# Patient Record
Sex: Male | Born: 1950
Health system: Southern US, Community
[De-identification: ages and names within clinical notes are randomized; demographics above are authoritative.]

## PROBLEM LIST (undated history)

## (undated) DIAGNOSIS — E785 Hyperlipidemia, unspecified: Secondary | ICD-10-CM

## (undated) DIAGNOSIS — F1020 Alcohol dependence, uncomplicated: Secondary | ICD-10-CM

## (undated) DIAGNOSIS — K7010 Alcoholic hepatitis without ascites: Secondary | ICD-10-CM

## (undated) HISTORY — DX: Hyperlipidemia, unspecified: E78.5

## (undated) HISTORY — DX: Alcoholic hepatitis without ascites: K70.10

## (undated) HISTORY — DX: Alcohol dependence, uncomplicated: F10.20

---

## 1973-01-28 HISTORY — PX: VASECTOMY: SHX75

## 2012-08-06 ENCOUNTER — Ambulatory Visit: Payer: Self-pay | Admitting: Family Medicine

## 2016-01-04 DIAGNOSIS — F1020 Alcohol dependence, uncomplicated: Secondary | ICD-10-CM | POA: Insufficient documentation

## 2016-01-04 DIAGNOSIS — K7010 Alcoholic hepatitis without ascites: Secondary | ICD-10-CM | POA: Insufficient documentation

## 2016-01-04 DIAGNOSIS — E785 Hyperlipidemia, unspecified: Secondary | ICD-10-CM | POA: Insufficient documentation

## 2016-01-04 DIAGNOSIS — E782 Mixed hyperlipidemia: Secondary | ICD-10-CM

## 2016-02-01 ENCOUNTER — Ambulatory Visit: Payer: Self-pay | Admitting: Family Medicine

## 2016-02-15 ENCOUNTER — Ambulatory Visit: Payer: Self-pay | Admitting: Family Medicine

## 2016-05-06 ENCOUNTER — Encounter: Payer: Self-pay | Admitting: Family Medicine

## 2016-05-06 ENCOUNTER — Ambulatory Visit (INDEPENDENT_AMBULATORY_CARE_PROVIDER_SITE_OTHER): Payer: Medicare HMO | Admitting: Family Medicine

## 2016-05-06 VITALS — BP 118/70 | HR 82 | Temp 98.7°F | Resp 17 | Ht 72.0 in | Wt 163.0 lb

## 2016-05-06 DIAGNOSIS — Z125 Encounter for screening for malignant neoplasm of prostate: Secondary | ICD-10-CM

## 2016-05-06 DIAGNOSIS — E782 Mixed hyperlipidemia: Secondary | ICD-10-CM

## 2016-05-06 DIAGNOSIS — K7010 Alcoholic hepatitis without ascites: Secondary | ICD-10-CM

## 2016-05-06 DIAGNOSIS — N390 Urinary tract infection, site not specified: Secondary | ICD-10-CM | POA: Diagnosis not present

## 2016-05-06 NOTE — Progress Notes (Signed)
BP 118/70   Pulse 82   Temp 98.7 F (37.1 C) (Oral)   Resp 17   Ht 6' (1.829 m)   Wt 163 lb (73.9 kg)   SpO2 100%   BMI 22.11 kg/m    Subjective:    Patient ID: Edgar Cruz, male    DOB: 1951/01/07, 66 y.o.   MRN: [id number]  HPI: Edgar Cruz is a 66 y.o. male who presents today to change care from another provider who has left the practice after being lost to follow up. He is here today to reestablish care  Chief Complaint  Patient presents with  . Establish Care   HYPERLIPIDEMIA Hyperlipidemia status: Not on anything, not watching his diet Satisfied with current treatment?  yes Side effects:  Not on anything Past cholesterol meds: None Supplements: none Aspirin:  no Chest pain:  no Coronary artery disease:  unknown Family history CAD:  unknown  History of elevated LFTs- saw GI in 2014, recommended MRCP for bilated CBD- did not have done. Negative Hepatitis, thought to be due to alcoholic hepatitis. States he's cut down on his ETOH intake. Feeling well. No concerns.    Active Ambulatory Problems    Diagnosis Date Noted  . Hyperlipidemia   . Alcoholic hepatitis   . Alcohol dependence (HCC)    Resolved Ambulatory Problems    Diagnosis Date Noted  . No Resolved Ambulatory Problems   Past Medical History:  Diagnosis Date  . Alcohol dependence (HCC)   . Alcoholic hepatitis   . Hyperlipidemia    Past Surgical History:  Procedure Laterality Date  . VASECTOMY  1975    No outpatient encounter prescriptions on file as of 05/06/2016.   No facility-administered encounter medications on file as of 05/06/2016.    No Known Allergies  Social History   Social History  . Marital status: Married    Spouse name: N/A  . Number of children: N/A  . Years of education: N/A   Social History Main Topics  . Smoking status: Current Every Day Smoker    Types: Cigarettes  . Smokeless tobacco: Never Used  . Alcohol use 1.8 oz/week    3 Cans of beer per week     Comment:  Wine 3 times a month  . Drug use: No  . Sexual activity: Not Asked   Other Topics Concern  . None   Social History Narrative  . None   Family History  Problem Relation Age of Onset  . Arthritis Mother   . Heart disease Brother     Review of Systems  Constitutional: Negative.   Respiratory: Negative.   Cardiovascular: Negative.   Gastrointestinal: Negative.   Psychiatric/Behavioral: Negative.     Per HPI unless specifically indicated above     Objective:    BP 118/70   Pulse 82   Temp 98.7 F (37.1 C) (Oral)   Resp 17   Ht 6' (1.829 m)   Wt 163 lb (73.9 kg)   SpO2 100%   BMI 22.11 kg/m   Wt Readings from Last 3 Encounters:  05/06/16 163 lb (73.9 kg)  12/08/12 163 lb (73.9 kg)    Physical Exam  Constitutional: He is oriented to person, place, and time. He appears well-developed and well-nourished. No distress.  HENT:  Head: Normocephalic and atraumatic.  Right Ear: Hearing normal.  Left Ear: Hearing normal.  Nose: Nose normal.  Eyes: Conjunctivae and lids are normal. Right eye exhibits no discharge. Left eye exhibits no discharge. No  scleral icterus.  Cardiovascular: Normal rate, regular rhythm, normal heart sounds and intact distal pulses.  Exam reveals no gallop and no friction rub.   No murmur heard. Pulmonary/Chest: Effort normal and breath sounds normal. No respiratory distress. He has no wheezes. He has no rales. He exhibits no tenderness.  Abdominal: Soft. Bowel sounds are normal. He exhibits no distension and no mass. There is no tenderness. There is no rebound and no guarding.  Musculoskeletal: Normal range of motion.  Neurological: He is alert and oriented to person, place, and time.  Skin: Skin is warm, dry and intact. No rash noted. He is not diaphoretic. No erythema. No pallor.  Psychiatric: He has a normal mood and affect. His speech is normal and behavior is normal. Judgment and thought content normal. Cognition and memory are normal.  Nursing  note and vitals reviewed.   No results found for this or any previous visit.    Assessment & Plan:   Problem List Items Addressed This Visit      Digestive   Alcoholic hepatitis    Recheck labs today. Feeling well. Await results.       Relevant Orders   CBC with Differential/Platelet   Comprehensive metabolic panel   TSH   UA/M w/rflx Culture, Routine     Other   Hyperlipidemia - Primary    Rechecking labs today. Await results.       Relevant Orders   Comprehensive metabolic panel   Lipid Panel w/o Chol/HDL Ratio   UA/M w/rflx Culture, Routine    Other Visit Diagnoses    Screening for prostate cancer       Checking labs today. Await results.    Relevant Orders   PSA       Follow up plan: Return ASAP , for Medicare Wellness.

## 2016-05-06 NOTE — Assessment & Plan Note (Signed)
Recheck labs today. Feeling well. Await results.

## 2016-05-06 NOTE — Assessment & Plan Note (Signed)
Rechecking labs today. Await results.  

## 2016-05-06 NOTE — Patient Instructions (Addendum)
Pneumococcal Conjugate Vaccine suspension for injection What is this medicine? PNEUMOCOCCAL VACCINE (NEU mo KOK al vak SEEN) is a vaccine used to prevent pneumococcus bacterial infections. These bacteria can cause serious infections like pneumonia, meningitis, and blood infections. This vaccine will lower your chance of getting pneumonia. If you do get pneumonia, it can make your symptoms milder and your illness shorter. This vaccine will not treat an infection and will not cause infection. This vaccine is recommended for infants and young children, adults with certain medical conditions, and adults 65 years or older. This medicine may be used for other purposes; ask your health care provider or pharmacist if you have questions. COMMON BRAND NAME(S): Prevnar, Prevnar 13 What should I tell my health care provider before I take this medicine? They need to know if you have any of these conditions: -bleeding problems -fever -immune system problems -an unusual or allergic reaction to pneumococcal vaccine, diphtheria toxoid, other vaccines, latex, other medicines, foods, dyes, or preservatives -pregnant or trying to get pregnant -breast-feeding How should I use this medicine? This vaccine is for injection into a muscle. It is given by a health care professional. A copy of Vaccine Information Statements will be given before each vaccination. Read this sheet carefully each time. The sheet may change frequently. Talk to your pediatrician regarding the use of this medicine in children. While this drug may be prescribed for children as young as 6 weeks old for selected conditions, precautions do apply. Overdosage: If you think you have taken too much of this medicine contact a poison control center or emergency room at once. NOTE: This medicine is only for you. Do not share this medicine with others. What if I miss a dose? It is important not to miss your dose. Call your doctor or health care professional  if you are unable to keep an appointment. What may interact with this medicine? -medicines for cancer chemotherapy -medicines that suppress your immune function -steroid medicines like prednisone or cortisone This list may not describe all possible interactions. Give your health care provider a list of all the medicines, herbs, non-prescription drugs, or dietary supplements you use. Also tell them if you smoke, drink alcohol, or use illegal drugs. Some items may interact with your medicine. What should I watch for while using this medicine? Mild fever and pain should go away in 3 days or less. Report any unusual symptoms to your doctor or health care professional. What side effects may I notice from receiving this medicine? Side effects that you should report to your doctor or health care professional as soon as possible: -allergic reactions like skin rash, itching or hives, swelling of the face, lips, or tongue -breathing problems -confused -fast or irregular heartbeat -fever over 102 degrees F -seizures -unusual bleeding or bruising -unusual muscle weakness Side effects that usually do not require medical attention (report to your doctor or health care professional if they continue or are bothersome): -aches and pains -diarrhea -fever of 102 degrees F or less -headache -irritable -loss of appetite -pain, tender at site where injected -trouble sleeping This list may not describe all possible side effects. Call your doctor for medical advice about side effects. You may report side effects to FDA at 1-800-FDA-1088. Where should I keep my medicine? This does not apply. This vaccine is given in a clinic, pharmacy, doctor's office, or other health care setting and will not be stored at home. NOTE: This sheet is a summary. It may not cover all possible information.   If you have questions about this medicine, talk to your doctor, pharmacist, or health care provider.  2018 Elsevier/Gold  Standard (2013-10-21 10:27:27)  

## 2016-05-07 ENCOUNTER — Encounter: Payer: Self-pay | Admitting: Family Medicine

## 2016-05-07 LAB — CBC WITH DIFFERENTIAL/PLATELET
BASOS ABS: 0 10*3/uL (ref 0.0–0.2)
Basos: 0 %
EOS (ABSOLUTE): 0 10*3/uL (ref 0.0–0.4)
Eos: 1 %
HEMOGLOBIN: 14.2 g/dL (ref 13.0–17.7)
Hematocrit: 41.9 % (ref 37.5–51.0)
IMMATURE GRANULOCYTES: 0 %
Immature Grans (Abs): 0 10*3/uL (ref 0.0–0.1)
LYMPHS ABS: 1.2 10*3/uL (ref 0.7–3.1)
Lymphs: 25 %
MCH: 33.6 pg — ABNORMAL HIGH (ref 26.6–33.0)
MCHC: 33.9 g/dL (ref 31.5–35.7)
MCV: 99 fL — ABNORMAL HIGH (ref 79–97)
MONOCYTES: 13 %
Monocytes Absolute: 0.6 10*3/uL (ref 0.1–0.9)
Neutrophils Absolute: 2.8 10*3/uL (ref 1.4–7.0)
Neutrophils: 61 %
Platelets: 186 10*3/uL (ref 150–379)
RBC: 4.22 x10E6/uL (ref 4.14–5.80)
RDW: 13.5 % (ref 12.3–15.4)
WBC: 4.6 10*3/uL (ref 3.4–10.8)

## 2016-05-07 LAB — COMPREHENSIVE METABOLIC PANEL
A/G RATIO: 1.5 (ref 1.2–2.2)
ALK PHOS: 39 IU/L (ref 39–117)
ALT: 39 IU/L (ref 0–44)
AST: 48 IU/L — AB (ref 0–40)
Albumin: 4 g/dL (ref 3.6–4.8)
BILIRUBIN TOTAL: 1 mg/dL (ref 0.0–1.2)
BUN / CREAT RATIO: 8 — AB (ref 10–24)
BUN: 6 mg/dL — ABNORMAL LOW (ref 8–27)
CHLORIDE: 98 mmol/L (ref 96–106)
CO2: 26 mmol/L (ref 18–29)
Calcium: 9 mg/dL (ref 8.6–10.2)
Creatinine, Ser: 0.75 mg/dL — ABNORMAL LOW (ref 0.76–1.27)
GFR calc Af Amer: 110 mL/min/{1.73_m2} (ref >59)
GFR calc non Af Amer: 95 mL/min/{1.73_m2} (ref >59)
GLOBULIN, TOTAL: 2.7 g/dL (ref 1.5–4.5)
Glucose: 76 mg/dL (ref 65–99)
POTASSIUM: 4 mmol/L (ref 3.5–5.2)
SODIUM: 140 mmol/L (ref 134–144)
Total Protein: 6.7 g/dL (ref 6.0–8.5)

## 2016-05-07 LAB — LIPID PANEL W/O CHOL/HDL RATIO
CHOLESTEROL TOTAL: 164 mg/dL (ref 100–199)
HDL: 92 mg/dL (ref >39)
LDL Calculated: 58 mg/dL (ref 0–99)
TRIGLYCERIDES: 69 mg/dL (ref 0–149)
VLDL Cholesterol Cal: 14 mg/dL (ref 5–40)

## 2016-05-07 LAB — TSH: TSH: 0.786 u[IU]/mL (ref 0.450–4.500)

## 2016-05-07 LAB — PSA: Prostate Specific Ag, Serum: 1.2 ng/mL (ref 0.0–4.0)

## 2016-05-08 LAB — UA/M W/RFLX CULTURE, ROUTINE
Bilirubin, UA: NEGATIVE
Glucose, UA: NEGATIVE
Nitrite, UA: NEGATIVE
PH UA: 7 (ref 5.0–7.5)
RBC UA: NEGATIVE
Specific Gravity, UA: 1.015 (ref 1.005–1.030)
UUROB: 2 mg/dL — AB (ref 0.2–1.0)

## 2016-05-08 LAB — MICROSCOPIC EXAMINATION: Bacteria, UA: NONE SEEN

## 2016-05-08 LAB — URINE CULTURE, REFLEX

## 2016-06-07 ENCOUNTER — Ambulatory Visit: Payer: Medicare HMO | Admitting: Family Medicine

## 2016-06-25 ENCOUNTER — Ambulatory Visit (INDEPENDENT_AMBULATORY_CARE_PROVIDER_SITE_OTHER): Payer: Medicare HMO | Admitting: Family Medicine

## 2016-06-25 ENCOUNTER — Encounter: Payer: Self-pay | Admitting: Family Medicine

## 2016-06-25 VITALS — BP 125/85 | HR 98 | Temp 99.1°F | Ht 69.7 in | Wt 152.6 lb

## 2016-06-25 DIAGNOSIS — Z0000 Encounter for general adult medical examination without abnormal findings: Secondary | ICD-10-CM

## 2016-06-25 DIAGNOSIS — Z1211 Encounter for screening for malignant neoplasm of colon: Secondary | ICD-10-CM

## 2016-06-25 DIAGNOSIS — F1020 Alcohol dependence, uncomplicated: Secondary | ICD-10-CM | POA: Diagnosis not present

## 2016-06-25 DIAGNOSIS — Z7189 Other specified counseling: Secondary | ICD-10-CM

## 2016-06-25 NOTE — Patient Instructions (Addendum)
Preventative Services:  Health Risk Assessment and Personalized Prevention Plan: Done today Bone Mass Measurements: N/A CVD Screening: Done last visit Colon Cancer Screening: Will do Cologuard Depression Screening: Done today Diabetes Screening: Done last visit Glaucoma Screening: See your eye doctor Hepatitis B vaccine: N/A Hepatitis C screening: up to date HIV Screening: N/A Flu Vaccine: Get in October Lung cancer Screening: Declined Obesity Screening: Done today Pneumonia Vaccines (2): Declined STI Screening: N/A PSA screening: done last visit   Health Maintenance, Male A healthy lifestyle and preventive care is important for your health and wellness. Ask your health care provider about what schedule of regular examinations is right for you. What should I know about weight and diet?  Eat a Healthy Diet  Eat plenty of vegetables, fruits, whole grains, low-fat dairy products, and lean protein.  Do not eat a lot of foods high in solid fats, added sugars, or salt. Maintain a Healthy Weight  Regular exercise can help you achieve or maintain a healthy weight. You should:  Do at least 150 minutes of exercise each week. The exercise should increase your heart rate and make you sweat (moderate-intensity exercise).  Do strength-training exercises at least twice a week. Watch Your Levels of Cholesterol and Blood Lipids  Have your blood tested for lipids and cholesterol every 5 years starting at 67 years of age. If you are at high risk for heart disease, you should start having your blood tested when you are 67 years old. You may need to have your cholesterol levels checked more often if:  Your lipid or cholesterol levels are high.  You are older than 67 years of age.  You are at high risk for heart disease. What should I know about cancer screening? Many types of cancers can be detected early and may often be prevented. Lung Cancer  You should be screened every year for lung  cancer if:  You are a current smoker who has smoked for at least 30 years.  You are a former smoker who has quit within the past 15 years.  Talk to your health care provider about your screening options, when you should start screening, and how often you should be screened. Colorectal Cancer  Routine colorectal cancer screening usually begins at 67 years of age and should be repeated every 5-10 years until you are 67 years old. You may need to be screened more often if early forms of precancerous polyps or small growths are found. Your health care provider may recommend screening at an earlier age if you have risk factors for colon cancer.  Your health care provider may recommend using home test kits to check for hidden blood in the stool.  A small camera at the end of a tube can be used to examine your colon (sigmoidoscopy or colonoscopy). This checks for the earliest forms of colorectal cancer. Prostate and Testicular Cancer  Depending on your age and overall health, your health care provider may do certain tests to screen for prostate and testicular cancer.  Talk to your health care provider about any symptoms or concerns you have about testicular or prostate cancer. Skin Cancer  Check your skin from head to toe regularly.  Tell your health care provider about any new moles or changes in moles, especially if:  There is a change in a mole's size, shape, or color.  You have a mole that is larger than a pencil eraser.  Always use sunscreen. Apply sunscreen liberally and repeat throughout the day.  Protect yourself by wearing long sleeves, pants, a wide-brimmed hat, and sunglasses when outside. What should I know about heart disease, diabetes, and high blood pressure?  If you are 80-74 years of age, have your blood pressure checked every 3-5 years. If you are 52 years of age or older, have your blood pressure checked every year. You should have your blood pressure measured twice-once  when you are at a hospital or clinic, and once when you are not at a hospital or clinic. Record the average of the two measurements. To check your blood pressure when you are not at a hospital or clinic, you can use:  An automated blood pressure machine at a pharmacy.  A home blood pressure monitor.  Talk to your health care provider about your target blood pressure.  If you are between 81-2 years old, ask your health care provider if you should take aspirin to prevent heart disease.  Have regular diabetes screenings by checking your fasting blood sugar level.  If you are at a normal weight and have a low risk for diabetes, have this test once every three years after the age of 13.  If you are overweight and have a high risk for diabetes, consider being tested at a younger age or more often.  A one-time screening for abdominal aortic aneurysm (AAA) by ultrasound is recommended for men aged 40-75 years who are current or former smokers. What should I know about preventing infection? Hepatitis B  If you have a higher risk for hepatitis B, you should be screened for this virus. Talk with your health care provider to find out if you are at risk for hepatitis B infection. Hepatitis C  Blood testing is recommended for:  Everyone born from [birth year] through 1966.  Anyone with known risk factors for hepatitis C. Sexually Transmitted Diseases (STDs)  You should be screened each year for STDs including gonorrhea and chlamydia if:  You are sexually active and are younger than 67 years of age.  You are older than 67 years of age and your health care provider tells you that you are at risk for this type of infection.  Your sexual activity has changed since you were last screened and you are at an increased risk for chlamydia or gonorrhea. Ask your health care provider if you are at risk.  Talk with your health care provider about whether you are at high risk of being infected with HIV. Your health  care provider may recommend a prescription medicine to help prevent HIV infection. What else can I do?  Schedule regular health, dental, and eye exams.  Stay current with your vaccines (immunizations).  Do not use any tobacco products, such as cigarettes, chewing tobacco, and e-cigarettes. If you need help quitting, ask your health care provider.  Limit alcohol intake to no more than 2 drinks per day. One drink equals 12 ounces of beer, 5 ounces of wine, or 1 ounces of hard liquor.  Do not use street drugs.  Do not share needles.  Ask your health care provider for help if you need support or information about quitting drugs.  Tell your health care provider if you often feel depressed.  Tell your health care provider if you have ever been abused or do not feel safe at home. This information is not intended to replace advice given to you by your health care provider. Make sure you discuss any questions you have with your health care provider. Document Released: 07/13/2007 Document Revised:  09/13/2015 Document Reviewed: 10/18/2014 Elsevier Interactive Patient Education  2017 [employer].

## 2016-06-25 NOTE — Progress Notes (Signed)
BP 125/85 (BP Location: Left Arm, Patient Position: Sitting, Cuff Size: Normal)   Pulse 98   Temp 99.1 F (37.3 C)   Ht 5' 9.7" (1.77 m)   Wt 152 lb 9.6 oz (69.2 kg)   SpO2 100%   BMI 22.08 kg/m    Subjective:    Patient ID: Edgar Cruz, male    DOB: 19-Sep-1950, 66 y.o.   MRN: [id number]  HPI: Edgar Cruz is a 66 y.o. male presenting on 06/25/2016 for comprehensive medical examination. Current medical complaints include:none  He currently lives with: alone Interim Problems from his last visit: no  Functional Status Survey: Is the patient deaf or have difficulty hearing?: No Does the patient have difficulty seeing, even when wearing glasses/contacts?: No Does the patient have difficulty concentrating, remembering, or making decisions?: No Does the patient have difficulty walking or climbing stairs?: No Does the patient have difficulty dressing or bathing?: No Does the patient have difficulty doing errands alone such as visiting a doctor's office or shopping?: No  FALL RISK: Fall Risk  06/25/2016 05/06/2016  Falls in the past year? No No    Depression Screen Depression screen [facility] 2/9 06/25/2016 05/06/2016  Decreased Interest 0 0  Down, Depressed, Hopeless 0 0  PHQ - 2 Score 0 0    Advanced Directives SEE APPROPRIATE AREA OF THE CHART  Past Medical History:  Past Medical History:  Diagnosis Date  . Alcohol dependence (HCC)   . Alcoholic hepatitis   . Hyperlipidemia     Surgical History:  Past Surgical History:  Procedure Laterality Date  . VASECTOMY  1975    Medications:  No current outpatient prescriptions on file prior to visit.   No current facility-administered medications on file prior to visit.     Allergies:  No Known Allergies  Social History:  Social History   Social History  . Marital status: Married    Spouse name: N/A  . Number of children: N/A  . Years of education: N/A   Occupational History  . Not on file.   Social History Main  Topics  . Smoking status: Current Every Day Smoker    Types: Cigarettes  . Smokeless tobacco: Never Used  . Alcohol use 1.8 oz/week    3 Cans of beer per week     Comment: Wine 3 times a month  . Drug use: No  . Sexual activity: Not on file   Other Topics Concern  . Not on file   Social History Narrative  . No narrative on file   History  Smoking Status  . Current Every Day Smoker  . Types: Cigarettes  Smokeless Tobacco  . Never Used   History  Alcohol Use  . 1.8 oz/week  . 3 Cans of beer per week    Comment: Wine 3 times a month    Family History:  Family History  Problem Relation Age of Onset  . Arthritis Mother   . Heart disease Brother     Past medical history, surgical history, medications, allergies, family history and social history reviewed with patient today and changes made to appropriate areas of the chart.   Review of Systems  Constitutional: Negative.   HENT: Negative.   Eyes: Negative.   Respiratory: Negative.   Cardiovascular: Negative.   Gastrointestinal: Negative.   Genitourinary: Negative.   Musculoskeletal: Negative.   Skin: Negative.   Neurological: Negative.   Endo/Heme/Allergies: Negative.   Psychiatric/Behavioral: Negative.     All other ROS negative except  what is listed above and in the HPI.      Objective:    BP 125/85 (BP Location: Left Arm, Patient Position: Sitting, Cuff Size: Normal)   Pulse 98   Temp 99.1 F (37.3 C)   Ht 5' 9.7" (1.77 m)   Wt 152 lb 9.6 oz (69.2 kg)   SpO2 100%   BMI 22.08 kg/m   Wt Readings from Last 3 Encounters:  06/25/16 152 lb 9.6 oz (69.2 kg)  05/06/16 163 lb (73.9 kg)  12/08/12 163 lb (73.9 kg)     Hearing Screening   125Hz  250Hz  500Hz  1000Hz  2000Hz  3000Hz  4000Hz  6000Hz  8000Hz   Right ear:   Fail 25 Fail  25    Left ear:   40 Fail 40  Fail      Visual Acuity Screening   Right eye Left eye Both eyes  Without correction: 20/20 20/20 20/20   With correction:       Physical Exam    Constitutional: He is oriented to person, place, and time. He appears well-developed and well-nourished. No distress.  HENT:  Head: Normocephalic and atraumatic.  Right Ear: Hearing, tympanic membrane, external ear and ear canal normal.  Left Ear: Hearing, tympanic membrane, external ear and ear canal normal.  Nose: Nose normal.  Mouth/Throat: Uvula is midline, oropharynx is clear and moist and mucous membranes are normal. No oropharyngeal exudate.  Eyes: Conjunctivae, EOM and lids are normal. Pupils are equal, round, and reactive to light. Right eye exhibits no discharge. Left eye exhibits no discharge. No scleral icterus.  Neck: Normal range of motion. Neck supple. No JVD present. No tracheal deviation present. No thyromegaly present.  Cardiovascular: Normal rate, regular rhythm, normal heart sounds and intact distal pulses.  Exam reveals no gallop and no friction rub.   No murmur heard. Pulmonary/Chest: Effort normal and breath sounds normal. No stridor. No respiratory distress. He has no wheezes. He has no rales. He exhibits no tenderness.  Abdominal: Soft. Bowel sounds are normal. He exhibits no distension and no mass. There is no tenderness. There is no rebound and no guarding. Hernia confirmed negative in the right inguinal area and confirmed negative in the left inguinal area.  Genitourinary: Rectum normal, testes normal and penis normal. Prostate is enlarged. Prostate is not tender. Cremasteric reflex is present. Right testis shows no mass, no swelling and no tenderness. Right testis is descended. Cremasteric reflex is not absent on the right side. Left testis shows no mass, no swelling and no tenderness. Left testis is descended. Cremasteric reflex is not absent on the left side. Uncircumcised. No phimosis, paraphimosis, hypospadias, penile erythema or penile tenderness. No discharge found.  Musculoskeletal: Normal range of motion. He exhibits no edema, tenderness or deformity.   Lymphadenopathy:    He has no cervical adenopathy.  Neurological: He is alert and oriented to person, place, and time. He has normal reflexes. He displays normal reflexes. No cranial nerve deficit. He exhibits normal muscle tone. Coordination normal.  Skin: Skin is warm, dry and intact. No rash noted. He is not diaphoretic. No erythema. No pallor.  Psychiatric: He has a normal mood and affect. His speech is normal and behavior is normal. Judgment and thought content normal. Cognition and memory are normal.  Nursing note and vitals reviewed.   6CIT Screen 06/25/2016 06/25/2016  What Year? 0 points 0 points  What month? 0 points 0 points  What time? 0 points 0 points  Count back from 20 0 points -  Months in  reverse 2 points -  Repeat phrase 2 points -  Total Score 4 -     Results for orders placed or performed in visit on 05/06/16  Microscopic Examination  Result Value Ref Range   WBC, UA 6-10 (A) 0 - 5 /hpf   RBC, UA 0-2 0 - 2 /hpf   Epithelial Cells (non renal) 0-10 0 - 10 /hpf   Mucus, UA Present Not Estab.   Bacteria, UA None seen None seen/Few  CBC with Differential/Platelet  Result Value Ref Range   WBC 4.6 3.4 - 10.8 x10E3/uL   RBC 4.22 4.14 - 5.80 x10E6/uL   Hemoglobin 14.2 13.0 - 17.7 g/dL   Hematocrit 40.9 81.1 - 51.0 %   MCV 99 (H) 79 - 97 fL   MCH 33.6 (H) 26.6 - 33.0 pg   MCHC 33.9 31.5 - 35.7 g/dL   RDW 91.4 78.2 - 95.6 %   Platelets 186 150 - 379 x10E3/uL   Neutrophils 61 Not Estab. %   Lymphs 25 Not Estab. %   Monocytes 13 Not Estab. %   Eos 1 Not Estab. %   Basos 0 Not Estab. %   Neutrophils Absolute 2.8 1.4 - 7.0 x10E3/uL   Lymphocytes Absolute 1.2 0.7 - 3.1 x10E3/uL   Monocytes Absolute 0.6 0.1 - 0.9 x10E3/uL   EOS (ABSOLUTE) 0.0 0.0 - 0.4 x10E3/uL   Basophils Absolute 0.0 0.0 - 0.2 x10E3/uL   Immature Granulocytes 0 Not Estab. %   Immature Grans (Abs) 0.0 0.0 - 0.1 x10E3/uL  Comprehensive metabolic panel  Result Value Ref Range   Glucose 76 65 -  99 mg/dL   BUN 6 (L) 8 - 27 mg/dL   Creatinine, Ser 2.13 (L) 0.76 - 1.27 mg/dL   GFR calc non Af Amer 95 >59 mL/min/1.73   GFR calc Af Amer 110 >59 mL/min/1.73   BUN/Creatinine Ratio 8 (L) 10 - 24   Sodium 140 134 - 144 mmol/L   Potassium 4.0 3.5 - 5.2 mmol/L   Chloride 98 96 - 106 mmol/L   CO2 26 18 - 29 mmol/L   Calcium 9.0 8.6 - 10.2 mg/dL   Total Protein 6.7 6.0 - 8.5 g/dL   Albumin 4.0 3.6 - 4.8 g/dL   Globulin, Total 2.7 1.5 - 4.5 g/dL   Albumin/Globulin Ratio 1.5 1.2 - 2.2   Bilirubin Total 1.0 0.0 - 1.2 mg/dL   Alkaline Phosphatase 39 39 - 117 IU/L   AST 48 (H) 0 - 40 IU/L   ALT 39 0 - 44 IU/L  Lipid Panel w/o Chol/HDL Ratio  Result Value Ref Range   Cholesterol, Total 164 100 - 199 mg/dL   Triglycerides 69 0 - 149 mg/dL   HDL 92 >08 mg/dL   VLDL Cholesterol Cal 14 5 - 40 mg/dL   LDL Calculated 58 0 - 99 mg/dL  TSH  Result Value Ref Range   TSH 0.786 0.450 - 4.500 uIU/mL  UA/M w/rflx Culture, Routine  Result Value Ref Range   Specific Gravity, UA 1.015 1.005 - 1.030   pH, UA 7.0 5.0 - 7.5   Color, UA Orange Yellow   Appearance Ur Cloudy (A) Clear   Leukocytes, UA 1+ (A) Negative   Protein, UA 2+ (A) Negative/Trace   Glucose, UA Negative Negative   Ketones, UA 1+ (A) Negative   RBC, UA Negative Negative   Bilirubin, UA Negative Negative   Urobilinogen, Ur 2.0 (H) 0.2 - 1.0 mg/dL   Nitrite, UA Negative Negative  Microscopic Examination See below:    Urinalysis Reflex Comment   PSA  Result Value Ref Range   Prostate Specific Ag, Serum 1.2 0.0 - 4.0 ng/mL  Urine Culture, Routine  Result Value Ref Range   Urine Culture, Routine Final report    Urine Culture result 1 Comment       Assessment & Plan:   Problem List Items Addressed This Visit      Other   Alcohol dependence (HCC)    Stable. Not interested in quitting right now.       Advance directive discussed with patient    Has not considered it. Information provided. A voluntary discussion about  advance care planning including the explanation and discussion of advance directives was extensively discussed  with the patient.  Explanation about the health care proxy and Living will was reviewed and packet with forms with explanation of how to fill them out was given. Patient was offered a separate Advance Care Planning visit for further assistance with forms.          Other Visit Diagnoses    Medicare annual wellness visit, subsequent    -  Primary   Preventative care discussed as below. Call with any concerns.    Screening for colon cancer       Will do cologuard. Ordered today.   Relevant Orders   Cologuard       Preventative Services:  Health Risk Assessment and Personalized Prevention Plan: Done today Bone Mass Measurements: N/A CVD Screening: Done last visit Colon Cancer Screening: Will do Cologuard Depression Screening: Done today Diabetes Screening: Done last visit Glaucoma Screening: See your eye doctor Hepatitis B vaccine: N/A Hepatitis C screening: up to date HIV Screening: N/A Flu Vaccine: Get in October Lung cancer Screening: Declined Obesity Screening: Done today Pneumonia Vaccines (2): Declined STI Screening: N/A PSA screening: done last visit  Discussed aspirin prophylaxis for myocardial infarction prevention and decision was it was not indicated  LABORATORY TESTING:  Health maintenance labs ordered today as discussed above.   The natural history of prostate cancer and ongoing controversy regarding screening and potential treatment outcomes of prostate cancer has been discussed with the patient. The meaning of a false positive PSA and a false negative PSA has been discussed. He indicates understanding of the limitations of this screening test and wished to proceed with screening PSA testing- checked last visit.   IMMUNIZATIONS:   - Tdap: Tetanus vaccination status reviewed: last tetanus booster within 10 years. - Influenza: Postponed to flu season -  Pneumovax: Refused - Prevnar: Refused - Zostavax vaccine: Will check on insurance  SCREENING: - Colonoscopy: Cologuard ordered today  Discussed with patient purpose of the colonoscopy is to detect colon cancer at curable precancerous or early stages   -Hearing Test: Ordered today  -Spirometry: Not applicable   PATIENT COUNSELING:    Sexuality: Discussed sexually transmitted diseases, partner selection, use of condoms, avoidance of unintended pregnancy  and contraceptive alternatives.   Advised to avoid cigarette smoking.  I discussed with the patient that most people either abstain from alcohol or drink within safe limits (<=14/week and <=4 drinks/occasion for males, <=7/weeks and <= 3 drinks/occasion for females) and that the risk for alcohol disorders and other health effects rises proportionally with the number of drinks per week and how often a drinker exceeds daily limits.  Discussed cessation/primary prevention of drug use and availability of treatment for abuse.   Diet: Encouraged to adjust caloric intake to maintain  or  achieve ideal body weight, to reduce intake of dietary saturated fat and total fat, to limit sodium intake by avoiding high sodium foods and not adding table salt, and to maintain adequate dietary potassium and calcium preferably from fresh fruits, vegetables, and low-fat dairy products.    stressed the importance of regular exercise  Injury prevention: Discussed safety belts, safety helmets, smoke detector, smoking near bedding or upholstery.   Dental health: Discussed importance of regular tooth brushing, flossing, and dental visits.   Follow up plan: NEXT PREVENTATIVE PHYSICAL DUE IN 1 YEAR. Return in about 1 year (around 06/25/2017) for Wellness.

## 2016-06-25 NOTE — Assessment & Plan Note (Signed)
Has not considered it. Information provided. A voluntary discussion about advance care planning including the explanation and discussion of advance directives was extensively discussed  with the patient.  Explanation about the health care proxy and Living will was reviewed and packet with forms with explanation of how to fill them out was given. Patient was offered a separate Advance Care Planning visit for further assistance with forms.

## 2016-06-25 NOTE — Assessment & Plan Note (Signed)
Stable. Not interested in quitting right now.

## 2017-03-20 ENCOUNTER — Other Ambulatory Visit: Payer: Self-pay

## 2017-03-20 ENCOUNTER — Ambulatory Visit: Payer: Self-pay | Admitting: *Deleted

## 2017-03-20 ENCOUNTER — Emergency Department: Payer: Medicare HMO

## 2017-03-20 ENCOUNTER — Emergency Department
Admission: EM | Admit: 2017-03-20 | Discharge: 2017-03-20 | Disposition: A | Discharge status: Home / Self Care | Payer: Medicare HMO | Attending: Emergency Medicine | Admitting: Emergency Medicine

## 2017-03-20 ENCOUNTER — Encounter: Payer: Self-pay | Admitting: Emergency Medicine

## 2017-03-20 DIAGNOSIS — Z5321 Procedure and treatment not carried out due to patient leaving prior to being seen by health care provider: Secondary | ICD-10-CM | POA: Diagnosis not present

## 2017-03-20 DIAGNOSIS — R079 Chest pain, unspecified: Secondary | ICD-10-CM | POA: Diagnosis not present

## 2017-03-20 LAB — CBC
HCT: 40.8 % (ref 40.0–52.0)
HEMOGLOBIN: 13.9 g/dL (ref 13.0–18.0)
MCH: 34.2 pg — ABNORMAL HIGH (ref 26.0–34.0)
MCHC: 34 g/dL (ref 32.0–36.0)
MCV: 100.6 fL — ABNORMAL HIGH (ref 80.0–100.0)
Platelets: 151 10*3/uL (ref 150–440)
RBC: 4.06 MIL/uL — ABNORMAL LOW (ref 4.40–5.90)
RDW: 13.3 % (ref 11.5–14.5)
WBC: 4.7 10*3/uL (ref 3.8–10.6)

## 2017-03-20 LAB — BASIC METABOLIC PANEL
Anion gap: 13 (ref 5–15)
BUN: 6 mg/dL (ref 6–20)
CHLORIDE: 103 mmol/L (ref 101–111)
CO2: 22 mmol/L (ref 22–32)
Calcium: 8.8 mg/dL — ABNORMAL LOW (ref 8.9–10.3)
Creatinine, Ser: 0.71 mg/dL (ref 0.61–1.24)
GFR calc Af Amer: >60 mL/min (ref >60)
Glucose, Bld: 77 mg/dL (ref 65–99)
Potassium: 3.8 mmol/L (ref 3.5–5.1)
Sodium: 138 mmol/L (ref 135–145)

## 2017-03-20 LAB — TROPONIN I: Troponin I: <0.03 ng/mL (ref <0.03)

## 2017-03-20 NOTE — Telephone Encounter (Signed)
Called in c/o having left sided chest pain "in this one spot" over my left chest.   He denies any other signs/symptoms with the chest pain.   He acknowledged he is a smoker.  He has been taking Advil for the last 2 days when the chest pain began.  It helps some but the pain never really goes away.  [facility] who [facility] with Dr. Laural BenesJohnson.   She instructed him to go to the ED.  He has agreed to do this.   [facility]  He is calling someone to take him now.   I instructed him that if he can't get a ride to call 911 so the ambulance could take him.   He said,  "I don't want to pay that extra money".   "I can get a ride now"    "I just need to make a phone call".    Reason for Disposition . [1] Chest pain lasts > 5 minutes AND [2] occurred > 3 days ago (72 hours) AND [3] NO chest pain or cardiac symptoms now  Answer Assessment - Initial Assessment Questions 1. LOCATION: "Where does it hurt?"       Left chest that start 2 days ago.    I've been taken some Advil and it helps a little. 2. RADIATION: "Does the pain go anywhere else?" (e.g., into neck, jaw, arms, back)     Just in one spot on the left side of my chest near my heart. 3. ONSET: "When did the chest pain begin?" (Minutes, hours or days)      2 days ago in the morning.   4. PATTERN "Does the pain come and go, or has it been constant since it started?"  "Does it get worse with exertion?"      Stays all the time. 5. DURATION: "How long does it last" (e.g., seconds, minutes, hours)     Constant.   The Advil helps  But it does not go all the way away. 6. SEVERITY: "How bad is the pain?"  (e.g., Scale 1-10; mild, moderate, or severe)    - MILD (1-3): doesn't interfere with normal activities     - MODERATE (4-7): interferes with normal activities or awakens from sleep    - SEVERE (8-10): excruciating pain, unable to do any normal activities       3 on the pain scale 7. CARDIAC  RISK FACTORS: "Do you have any history of heart problems or risk factors for heart disease?" (e.g., prior heart attack, angina; high blood pressure, diabetes, being overweight, high cholesterol, smoking, or strong family history of heart disease)     Smoker.    8. PULMONARY RISK FACTORS: "Do you have any history of lung disease?"  (e.g., blood clots in lung, asthma, emphysema, birth control pills)     No lung history but I do smoke. 9. CAUSE: "What do you think is causing the chest pain?"     I don't know.   It just showed up one day. 10. OTHER SYMPTOMS: "Do you have any other symptoms?" (e.g., dizziness, nausea, vomiting, sweating, fever, difficulty breathing, cough)       Denies any of the above. 11. PREGNANCY: "Is there any chance you are pregnant?" "When was your last menstrual period?"       N/A  Protocols used: CHEST PAIN-A-AH

## 2017-03-20 NOTE — ED Notes (Signed)
Third call to room pt, no show.

## 2017-03-20 NOTE — ED Triage Notes (Signed)
Pt with left chest pain since Monday. Pain 3/10 and feels like an ache. Pt states that advil makes pain better, last took this am. Pt reports that he has pain when laying in bed.

## 2017-03-20 NOTE — ED Notes (Signed)
Second call to room pt.

## 2017-03-20 NOTE — ED Notes (Signed)
Pt called to be taken back to room.

## 2017-03-20 NOTE — Telephone Encounter (Signed)
-   As below

## 2017-03-24 ENCOUNTER — Telehealth: Payer: Self-pay | Admitting: Emergency Medicine

## 2017-03-24 NOTE — Telephone Encounter (Signed)
Called patient due to lwot to inquire about condition and follow up plans. Says he is not having chest pain now and had to leave to get transportation.  He says he thought that everything was okay on his tests.  I explained that we cannot prove pain is not from heart without being seen by MD.  I advised him to call his pcp today to have them review the results and that they could possibly do exam in the office.  He agrees.

## 2017-04-01 ENCOUNTER — Ambulatory Visit: Payer: Medicare HMO | Admitting: Family Medicine

## 2017-04-01 ENCOUNTER — Encounter: Payer: Self-pay | Admitting: Family Medicine

## 2017-04-01 VITALS — BP 133/84 | HR 69 | Wt 154.0 lb

## 2017-04-01 DIAGNOSIS — F1020 Alcohol dependence, uncomplicated: Secondary | ICD-10-CM | POA: Diagnosis not present

## 2017-04-01 DIAGNOSIS — R079 Chest pain, unspecified: Secondary | ICD-10-CM

## 2017-04-01 DIAGNOSIS — E782 Mixed hyperlipidemia: Secondary | ICD-10-CM

## 2017-04-01 DIAGNOSIS — R062 Wheezing: Secondary | ICD-10-CM | POA: Diagnosis not present

## 2017-04-01 DIAGNOSIS — K7010 Alcoholic hepatitis without ascites: Secondary | ICD-10-CM

## 2017-04-01 MED ORDER — ALBUTEROL SULFATE (2.5 MG/3ML) 0.083% IN NEBU: 2.5000 mg | INHALATION_SOLUTION | Freq: Four times a day (QID) | RESPIRATORY_TRACT | 1 refills | Status: DC | PRN

## 2017-04-01 MED ORDER — ALBUTEROL SULFATE (2.5 MG/3ML) 0.083% IN NEBU
2.5000 mg | INHALATION_SOLUTION | Freq: Once | RESPIRATORY_TRACT | Status: AC
  Administered 2017-04-01: 2.5 mg via RESPIRATORY_TRACT

## 2017-04-01 MED ORDER — NAPROXEN 500 MG PO TABS: 500.0000 mg | ORAL_TABLET | Freq: Two times a day (BID) | ORAL | 3 refills | Status: AC

## 2017-04-01 MED ORDER — ALBUTEROL SULFATE HFA 108 (90 BASE) MCG/ACT IN AERS: 2.0000 | INHALATION_SPRAY | Freq: Four times a day (QID) | RESPIRATORY_TRACT | 3 refills | Status: AC | PRN

## 2017-04-01 NOTE — Patient Instructions (Addendum)
Chest Wall Pain °Chest wall pain is pain in or around the bones and muscles of your chest. Sometimes, an injury causes this pain. Sometimes, the cause may not be known. This pain may take several weeks or longer to get better. °Follow these instructions at home: °Pay attention to any changes in your symptoms. Take these actions to help with your pain: °· Rest as told by your health care provider. °· Avoid activities that cause pain. These include any activities that use your chest muscles or your abdominal and side muscles to lift heavy items. °· If directed, apply ice to the painful area: °? Put ice in a plastic bag. °? Place a towel between your skin and the bag. °? Leave the ice on for 20 minutes, 2-3 times per day. °· Take over-the-counter and prescription medicines only as told by your health care provider. °· Do not use tobacco products, including cigarettes, chewing tobacco, and e-cigarettes. If you need help quitting, ask your health care provider. °· Keep all follow-up visits as told by your health care provider. This is important. ° °Contact a health care provider if: °· You have a fever. °· Your chest pain becomes worse. °· You have new symptoms. °Get help right away if: °· You have nausea or vomiting. °· You feel sweaty or light-headed. °· You have a cough with phlegm (sputum) or you cough up blood. °· You develop shortness of breath. °This information is not intended to replace advice given to you by your health care provider. Make sure you discuss any questions you have with your health care provider. °Document Released: 01/14/2005 Document Revised: 05/25/2015 Document Reviewed: 04/11/2014 °[employer] Interactive Patient Education © 2018 [employer] ° °

## 2017-04-01 NOTE — Assessment & Plan Note (Signed)
Rechecking labs today. Await results.  

## 2017-04-01 NOTE — Progress Notes (Signed)
BP 133/84   Pulse 69   Wt 154 lb (69.9 kg)   SpO2 99%   BMI 20.89 kg/m    Subjective:    Patient ID: Edgar Cruz, male    DOB: 1950/08/15, 67 y.o.   MRN: [id number]  HPI: Edgar Cruz is a 67 y.o. male  Chief Complaint  Patient presents with  . Chest Pain    No longer experiencing. x 1 day. Pt awoke w/ pain. No SOB.    Per chart review, went to the ER 2 weeks ago with chest pain. "Pt with left chest pain since Monday. Pain 3/10 and feels like an ache. Pt states that advil makes pain better, last took this am. Pt reports that he has pain when laying in bed." Patient left the ER without being evaluated. Troponin negative x1. Elevated MCV without anemia and low calcium. Remainder of labs were normal.   CHEST PAIN- resolved, has not hurt since that time unless he pushes on it Time since onset: 2 weeks, has resolved Duration: 2 days Onset: sudden Quality: "pain" Severity: 3/10 Location: left para substernal Radiation: none Episode duration: 2 days Frequency: constant Related to exertion: no Activity when pain started:  Trauma: no Anxiety/recent stressors: no Aggravating factors: touching it Alleviating factors: ibuprofen Status: better Treatments attempted: ibuprofen  Current pain status: pain free Shortness of breath: no Cough: no Nausea: no Diaphoresis: no Heartburn: no Palpitations: no  Relevant past medical, surgical, family and social history reviewed and updated as indicated. Interim medical history since our last visit reviewed. Allergies and medications reviewed and updated.  Review of Systems  Constitutional: Negative.   HENT: Negative.   Respiratory: Negative.   Cardiovascular: Positive for chest pain. Negative for palpitations and leg swelling.  Gastrointestinal: Negative.   Psychiatric/Behavioral: Negative.     Per HPI unless specifically indicated above     Objective:    BP 133/84   Pulse 69   Wt 154 lb (69.9 kg)   SpO2 99%   BMI 20.89  kg/m   Wt Readings from Last 3 Encounters:  04/01/17 154 lb (69.9 kg)  03/20/17 150 lb (68 kg)  06/25/16 152 lb 9.6 oz (69.2 kg)    Physical Exam  Constitutional: He is oriented to person, place, and time. He appears well-developed and well-nourished. No distress.  HENT:  Head: Normocephalic and atraumatic.  Right Ear: Hearing normal.  Left Ear: Hearing normal.  Nose: Nose normal.  Eyes: Conjunctivae and lids are normal. Right eye exhibits no discharge. Left eye exhibits no discharge. No scleral icterus.  Cardiovascular: Normal rate, regular rhythm, normal heart sounds and intact distal pulses. Exam reveals no gallop and no friction rub.  No murmur heard. Pulmonary/Chest: Effort normal. No respiratory distress. He has wheezes. He has no rales. He exhibits no tenderness.  Musculoskeletal: Normal range of motion. He exhibits tenderness (tenderness to palpation of his L chest).  Neurological: He is alert and oriented to person, place, and time.  Skin: Skin is warm, dry and intact. No rash noted. He is not diaphoretic. No erythema. No pallor.  Psychiatric: He has a normal mood and affect. His speech is normal and behavior is normal. Judgment and thought content normal. Cognition and memory are normal.  Nursing note and vitals reviewed.   Results for orders placed or performed during the hospital encounter of 03/20/17  Basic metabolic panel  Result Value Ref Range   Sodium 138 135 - 145 mmol/L   Potassium 3.8 3.5 - 5.1 mmol/L  Chloride 103 101 - 111 mmol/L   CO2 22 22 - 32 mmol/L   Glucose, Bld 77 65 - 99 mg/dL   BUN 6 6 - 20 mg/dL   Creatinine, Ser 6.04 0.61 - 1.24 mg/dL   Calcium 8.8 (L) 8.9 - 10.3 mg/dL   GFR calc non Af Amer >60 >60 mL/min   GFR calc Af Amer >60 >60 mL/min   Anion gap 13 5 - 15  CBC  Result Value Ref Range   WBC 4.7 3.8 - 10.6 K/uL   RBC 4.06 (L) 4.40 - 5.90 MIL/uL   Hemoglobin 13.9 13.0 - 18.0 g/dL   HCT 54.0 98.1 - 19.1 %   MCV 100.6 (H) 80.0 - 100.0 fL    MCH 34.2 (H) 26.0 - 34.0 pg   MCHC 34.0 32.0 - 36.0 g/dL   RDW 47.8 29.5 - 62.1 %   Platelets 151 150 - 440 K/uL  Troponin I  Result Value Ref Range   Troponin I <0.03 <0.03 ng/mL      Assessment & Plan:   Problem List Items Addressed This Visit      Digestive   Alcoholic hepatitis    Rechecking labs today. Await results.       Relevant Orders   CBC with Differential/Platelet   Comprehensive metabolic panel   TSH     Other   Hyperlipidemia    Rechecking labs today. Await results.       Relevant Orders   CBC with Differential/Platelet   Comprehensive metabolic panel   Lipid Panel w/o Chol/HDL Ratio   TSH   Alcohol dependence (HCC)    Rechecking labs today. Await results.        Other Visit Diagnoses    Chest pain, unspecified type    -  Primary   Tender to palpation- will treat with naproxen. EKG shows PVCs, otherwise normal.   Relevant Orders   EKG 12-Lead (Completed)   CBC with Differential/Platelet   Comprehensive metabolic panel   TSH   Wheezing       Better following nebulizer. Rx for albuterol sent to his pharmacy. Call with any concerns.    Relevant Medications   albuterol (PROVENTIL) (2.5 MG/3ML) 0.083% nebulizer solution 2.5 mg (Completed)       Follow up plan: Return As scheduled.

## 2017-04-02 ENCOUNTER — Encounter: Payer: Self-pay | Admitting: Family Medicine

## 2017-04-02 LAB — CBC WITH DIFFERENTIAL/PLATELET
BASOS ABS: 0 10*3/uL (ref 0.0–0.2)
BASOS: 0 %
EOS (ABSOLUTE): 0 10*3/uL (ref 0.0–0.4)
Eos: 1 %
Hematocrit: 40.3 % (ref 37.5–51.0)
Hemoglobin: 13.9 g/dL (ref 13.0–17.7)
IMMATURE GRANS (ABS): 0 10*3/uL (ref 0.0–0.1)
Immature Granulocytes: 0 %
LYMPHS: 29 %
Lymphocytes Absolute: 1.7 10*3/uL (ref 0.7–3.1)
MCH: 33.4 pg — AB (ref 26.6–33.0)
MCHC: 34.5 g/dL (ref 31.5–35.7)
MCV: 97 fL (ref 79–97)
MONOS ABS: 0.9 10*3/uL (ref 0.1–0.9)
Monocytes: 15 %
NEUTROS ABS: 3.3 10*3/uL (ref 1.4–7.0)
Neutrophils: 55 %
PLATELETS: 198 10*3/uL (ref 150–379)
RBC: 4.16 x10E6/uL (ref 4.14–5.80)
RDW: 13.3 % (ref 12.3–15.4)
WBC: 6 10*3/uL (ref 3.4–10.8)

## 2017-04-02 LAB — COMPREHENSIVE METABOLIC PANEL
A/G RATIO: 1.6 (ref 1.2–2.2)
ALK PHOS: 44 IU/L (ref 39–117)
ALT: 52 IU/L — AB (ref 0–44)
AST: 61 IU/L — AB (ref 0–40)
Albumin: 4.5 g/dL (ref 3.6–4.8)
BILIRUBIN TOTAL: 0.7 mg/dL (ref 0.0–1.2)
BUN/Creatinine Ratio: 7 — ABNORMAL LOW (ref 10–24)
BUN: 5 mg/dL — AB (ref 8–27)
CHLORIDE: 98 mmol/L (ref 96–106)
CO2: 23 mmol/L (ref 20–29)
Calcium: 9.5 mg/dL (ref 8.6–10.2)
Creatinine, Ser: 0.73 mg/dL — ABNORMAL LOW (ref 0.76–1.27)
GFR calc Af Amer: 110 mL/min/{1.73_m2} (ref >59)
GFR calc non Af Amer: 95 mL/min/{1.73_m2} (ref >59)
GLUCOSE: 74 mg/dL (ref 65–99)
Globulin, Total: 2.9 g/dL (ref 1.5–4.5)
POTASSIUM: 3.8 mmol/L (ref 3.5–5.2)
Sodium: 138 mmol/L (ref 134–144)
TOTAL PROTEIN: 7.4 g/dL (ref 6.0–8.5)

## 2017-04-02 LAB — TSH: TSH: 0.948 u[IU]/mL (ref 0.450–4.500)

## 2017-04-02 LAB — LIPID PANEL W/O CHOL/HDL RATIO
Cholesterol, Total: 156 mg/dL (ref 100–199)
HDL: 95 mg/dL (ref >39)
LDL CALC: 52 mg/dL (ref 0–99)
Triglycerides: 43 mg/dL (ref 0–149)
VLDL CHOLESTEROL CAL: 9 mg/dL (ref 5–40)

## 2017-06-27 ENCOUNTER — Ambulatory Visit: Payer: Medicare HMO

## 2017-07-04 ENCOUNTER — Ambulatory Visit (INDEPENDENT_AMBULATORY_CARE_PROVIDER_SITE_OTHER): Payer: Medicare HMO

## 2017-07-04 VITALS — BP 118/64 | HR 85 | Temp 98.5°F | Resp 17 | Ht 71.0 in | Wt 168.4 lb

## 2017-07-04 DIAGNOSIS — Z0000 Encounter for general adult medical examination without abnormal findings: Secondary | ICD-10-CM

## 2017-07-04 NOTE — Progress Notes (Signed)
Subjective:   Edgar Cruz is a 67 y.o. male who presents for Medicare Annual/Subsequent preventive examination.  Review of Systems:  Cardiac Risk Factors include: advanced age (>2men, >13 women);male gender;smoking/ tobacco exposure     Objective:    Vitals: BP 118/64 (BP Location: Left Arm, Patient Position: Sitting)   Pulse 85   Temp 98.5 F (36.9 C) (Temporal)   Resp 17   Ht 5\' 11"  (1.803 m)   Wt 168 lb 6.4 oz (76.4 kg)   SpO2 98%   BMI 23.49 kg/m   Body mass index is 23.49 kg/m.  Advanced Directives 07/04/2017 03/20/2017 06/25/2016  Does Patient Have a Medical Advance Directive? No No No  Would patient like information on creating a medical advance directive? No - Patient declined - Yes (MAU/Ambulatory/Procedural Areas - Information given)    Tobacco Social History   Tobacco Use  Smoking Status Current Every Day Smoker  . Packs/day: 1.00  . Types: Cigarettes  Smokeless Tobacco Never Used     Ready to quit: No Counseling given: Yes   Clinical Intake:  Pre-visit preparation completed: Yes  Pain : No/denies pain     Nutritional Status: BMI of 19-24  Normal Nutritional Risks: None Diabetes: No  How often do you need to have someone help you when you read instructions, pamphlets, or other written materials from your doctor or pharmacy?: 1 - Never What is the last grade level you completed in school?: 12th grade  Interpreter Needed?: No  Information entered by :: Aleiya Rye,LPN   Past Medical History:  Diagnosis Date  . Alcohol dependence (HCC)   . Alcoholic hepatitis   . Hyperlipidemia    Past Surgical History:  Procedure Laterality Date  . VASECTOMY  1975   Family History  Problem Relation Age of Onset  . Arthritis Mother   . Heart disease Brother   . Parkinson's disease Sister    Social History   Socioeconomic History  . Marital status: Married    Spouse name: Not on file  . Number of children: Not on file  . Years of education: Not  on file  . Highest education level: Not on file  Occupational History  . Not on file  Social Needs  . Financial resource strain: Not hard at all  . Food insecurity:    Worry: Never true    Inability: Never true  . Transportation needs:    Medical: No    Non-medical: No  Tobacco Use  . Smoking status: Current Every Day Smoker    Packs/day: 1.00    Types: Cigarettes  . Smokeless tobacco: Never Used  Substance and Sexual Activity  . Alcohol use: Yes    Alcohol/week: 2.4 oz    Types: 4 Cans of beer per week  . Drug use: No  . Sexual activity: Not on file  Lifestyle  . Physical activity:    Days per week: 0 days    Minutes per session: 0 min  . Stress: Not at all  Relationships  . Social connections:    Talks on phone: More than three times a week    Gets together: Twice a week    Attends religious service: More than 4 times per year    Active member of club or organization: No    Attends meetings of clubs or organizations: Never    Relationship status: Widowed  Other Topics Concern  . Not on file  Social History Narrative  . Not on file  Outpatient Encounter Medications as of 07/04/2017  Medication Sig  . albuterol (PROVENTIL HFA;VENTOLIN HFA) 108 (90 Base) MCG/ACT inhaler Inhale 2 puffs into the lungs every 6 (six) hours as needed for wheezing or shortness of breath. (Patient not taking: Reported on 07/04/2017)  . naproxen (NAPROSYN) 500 MG tablet Take 1 tablet (500 mg total) by mouth 2 (two) times daily with a meal. (Patient not taking: Reported on 07/04/2017)   No facility-administered encounter medications on file as of 07/04/2017.     Activities of Daily Living In your present state of health, do you have any difficulty performing the following activities: 07/04/2017  Hearing? N  Vision? N  Difficulty concentrating or making decisions? N  Walking or climbing stairs? N  Dressing or bathing? N  Doing errands, shopping? N  Preparing Food and eating ? N  Using the  Toilet? N  In the past six months, have you accidently leaked urine? N  Do you have problems with loss of bowel control? N  Managing your Medications? N  Managing your Finances? N  Housekeeping or managing your Housekeeping? N  Some recent data might be hidden    Patient Care Team: Dorcas CarrowJohnson, Megan P, DO as PCP - General (Family Medicine)   Assessment:   This is a routine wellness examination for [facility].  Exercise Activities and Dietary recommendations Current Exercise Habits: The patient has a physically strenous job, but has no regular exercise apart from work., Exercise limited by: None identified  Goals    . Quit Smoking     Smoking cessation discussed       Fall Risk Fall Risk  07/04/2017 06/25/2016 05/06/2016  Falls in the past year? No No No   Is the patient's home free of loose throw rugs in walkways, pet beds, electrical cords, etc?   no      Grab bars in the bathroom? no      Handrails on the stairs?   no      Adequate lighting?   yes  Timed Get Up and Go Performed: Completed in 9 seconds with no use of assistive devices, steady gait. No intervention needed at this time.   Depression Screen PHQ 2/9 Scores 07/04/2017 06/25/2016 05/06/2016  PHQ - 2 Score 0 0 0    Cognitive Function     6CIT Screen 07/04/2017 06/25/2016 06/25/2016  What Year? 0 points 0 points 0 points  What month? 0 points 0 points 0 points  What time? 0 points 0 points 0 points  Count back from 20 0 points 0 points -  Months in reverse 0 points 2 points -  Repeat phrase 2 points 2 points -  Total Score 2 4 -    Immunization History  Administered Date(s) Administered  . Td 07/30/2012    Qualifies for Shingles Vaccine? Yes, discussed shingrix vaccine   Screening Tests Health Maintenance  Topic Date Due  . COLONOSCOPY  07/05/2018 (Originally 03/28/1999)  . PNA vac Low Risk Adult (1 of 2 - PCV13) 07/05/2018 (Originally 03/27/2014)  . INFLUENZA VACCINE  08/28/2017  . TETANUS/TDAP  07/31/2022  .  Hepatitis C Screening  Addressed   Cancer Screenings: Lung: Low Dose CT Chest recommended if Age 34-80 years, 30 pack-year currently smoking OR have quit w/in 15years. Patient does qualify.- declined Colorectal: due now - declined  Additional Screening: Hepatitis C Screening:completed 08/14/2012      Plan:    I have personally reviewed and addressed the Medicare Annual Wellness questionnaire and have noted  the following in the patient's chart:  A. Medical and social history B. Use of alcohol, tobacco or illicit drugs  C. Current medications and supplements D. Functional ability and status E.  Nutritional status F.  Physical activity G. Advance directives H. List of other physicians I.  Hospitalizations, surgeries, and ER visits in previous 12 months J.  Vitals K. Screenings such as hearing and vision if needed, cognitive and depression L. Referrals and appointments   In addition, I have reviewed and discussed with patient certain preventive protocols, quality metrics, and best practice recommendations. A written personalized care plan for preventive services as well as general preventive health recommendations were provided to patient.   Signed,  Marin Roberts, LPN Nurse Health Advisor   Nurse Notes:none

## 2017-07-04 NOTE — Patient Instructions (Addendum)
[name] , Thank you for taking time to come for your Medicare Wellness Visit. I appreciate your ongoing commitment to your health goals. Please review the following plan we discussed and let me know if I can assist you in the future.   Screening recommendations/referrals: Colonoscopy: due now- declined  Recommended yearly ophthalmology/optometry visit for glaucoma screening and checkup Recommended yearly dental visit for hygiene and checkup  Vaccinations: Influenza vaccine: due 09/2017 Pneumococcal vaccine: due now- declined  Tdap vaccine: up to date Shingles vaccine: shingrix eligible, check your insurance company for coverage    Advanced directives: Advance directive discussed with you today. Even though you declined this today please call our office should you change your mind and we can give you the proper paperwork for you to fill out.  Conditions/risks identified: Smoking cessation discussed  Next appointment: Follow up in one year for your annual wellness exam.   Preventive Care 65 Years and Older, Male Preventive care refers to lifestyle choices and visits with your health care provider that can promote health and wellness. What does preventive care include?  A yearly physical exam. This is also called an annual well check.  Dental exams once or twice a year.  Routine eye exams. Ask your health care provider how often you should have your eyes checked.  Personal lifestyle choices, including:  Daily care of your teeth and gums.  Regular physical activity.  Eating a healthy diet.  Avoiding tobacco and drug use.  Limiting alcohol use.  Practicing safe sex.  Taking low doses of aspirin every day.  Taking vitamin and mineral supplements as recommended by your health care provider. What happens during an annual well check? The services and screenings done by your health care provider during your annual well check will depend on your age, overall health, lifestyle  risk factors, and family history of disease. Counseling  Your health care provider may ask you questions about your:  Alcohol use.  Tobacco use.  Drug use.  Emotional well-being.  Home and relationship well-being.  Sexual activity.  Eating habits.  History of falls.  Memory and ability to understand (cognition).  Work and work Astronomerenvironment. Screening  You may have the following tests or measurements:  Height, weight, and BMI.  Blood pressure.  Lipid and cholesterol levels. These may be checked every 5 years, or more frequently if you are over 68 years old.  Skin check.  Lung cancer screening. You may have this screening every year starting at age 68 if you have a 30-pack-year history of smoking and currently smoke or have quit within the past 15 years.  Fecal occult blood test (FOBT) of the stool. You may have this test every year starting at age 68.  Flexible sigmoidoscopy or colonoscopy. You may have a sigmoidoscopy every 5 years or a colonoscopy every 10 years starting at age 10050.  Prostate cancer screening. Recommendations will vary depending on your family history and other risks.  Hepatitis C blood test.  Hepatitis B blood test.  Sexually transmitted disease (STD) testing.  Diabetes screening. This is done by checking your blood sugar (glucose) after you have not eaten for a while (fasting). You may have this done every 1-3 years.  Abdominal aortic aneurysm (AAA) screening. You may need this if you are a current or former smoker.  Osteoporosis. You may be screened starting at age 68 if you are at high risk. Talk with your health care provider about your test results, treatment options, and if necessary,  the need for more tests. Vaccines  Your health care provider may recommend certain vaccines, such as:  Influenza vaccine. This is recommended every year.  Tetanus, diphtheria, and acellular pertussis (Tdap, Td) vaccine. You may need a Td booster every 10  years.  Zoster vaccine. You may need this after age 66.  Pneumococcal 13-valent conjugate (PCV13) vaccine. One dose is recommended after age 8.  Pneumococcal polysaccharide (PPSV23) vaccine. One dose is recommended after age 75. Talk to your health care provider about which screenings and vaccines you need and how often you need them. This information is not intended to replace advice given to you by your health care provider. Make sure you discuss any questions you have with your health care provider. Document Released: 02/10/2015 Document Revised: 10/04/2015 Document Reviewed: 11/15/2014 Elsevier Interactive Patient Education  2017 [place] Prevention in the Home Falls can cause injuries. They can happen to people of all ages. There are many things you can do to make your home safe and to help prevent falls. What can I do on the outside of my home?  Regularly fix the edges of walkways and driveways and fix any cracks.  Remove anything that might make you trip as you walk through a door, such as a raised step or threshold.  Trim any bushes or trees on the path to your home.  Use bright outdoor lighting.  Clear any walking paths of anything that might make someone trip, such as rocks or tools.  Regularly check to see if handrails are loose or broken. Make sure that both sides of any steps have handrails.  Any raised decks and porches should have guardrails on the edges.  Have any leaves, snow, or ice cleared regularly.  Use sand or salt on walking paths during winter.  Clean up any spills in your garage right away. This includes oil or grease spills. What can I do in the bathroom?  Use night lights.  Install grab bars by the toilet and in the tub and shower. Do not use towel bars as grab bars.  Use non-skid mats or decals in the tub or shower.  If you need to sit down in the shower, use a plastic, non-slip stool.  Keep the floor dry. Clean up any water that  spills on the floor as soon as it happens.  Remove soap buildup in the tub or shower regularly.  Attach bath mats securely with double-sided non-slip rug tape.  Do not have throw rugs and other things on the floor that can make you trip. What can I do in the bedroom?  Use night lights.  Make sure that you have a light by your bed that is easy to reach.  Do not use any sheets or blankets that are too big for your bed. They should not hang down onto the floor.  Have a firm chair that has side arms. You can use this for support while you get dressed.  Do not have throw rugs and other things on the floor that can make you trip. What can I do in the kitchen?  Clean up any spills right away.  Avoid walking on wet floors.  Keep items that you use a lot in easy-to-reach places.  If you need to reach something above you, use a strong step stool that has a grab bar.  Keep electrical cords out of the way.  Do not use floor polish or wax that makes floors slippery. If you must use  wax, use non-skid floor wax.  Do not have throw rugs and other things on the floor that can make you trip. What can I do with my stairs?  Do not leave any items on the stairs.  Make sure that there are handrails on both sides of the stairs and use them. Fix handrails that are broken or loose. Make sure that handrails are as long as the stairways.  Check any carpeting to make sure that it is firmly attached to the stairs. Fix any carpet that is loose or worn.  Avoid having throw rugs at the top or bottom of the stairs. If you do have throw rugs, attach them to the floor with carpet tape.  Make sure that you have a light switch at the top of the stairs and the bottom of the stairs. If you do not have them, ask someone to add them for you. What else can I do to help prevent falls?  Wear shoes that:  Do not have high heels.  Have rubber bottoms.  Are comfortable and fit you well.  Are closed at the  toe. Do not wear sandals.  If you use a stepladder:  Make sure that it is fully opened. Do not climb a closed stepladder.  Make sure that both sides of the stepladder are locked into place.  Ask someone to hold it for you, if possible.  Clearly mark and make sure that you can see:  Any grab bars or handrails.  First and last steps.  Where the edge of each step is.  Use tools that help you move around (mobility aids) if they are needed. These include:  Canes.  Walkers.  Scooters.  Crutches.  Turn on the lights when you go into a dark area. Replace any light bulbs as soon as they burn out.  Set up your furniture so you have a clear path. Avoid moving your furniture around.  If any of your floors are uneven, fix them.  If there are any pets around you, be aware of where they are.  Review your medicines with your doctor. Some medicines can make you feel dizzy. This can increase your chance of falling. Ask your doctor what other things that you can do to help prevent falls. This information is not intended to replace advice given to you by your health care provider. Make sure you discuss any questions you have with your health care provider. Document Released: 11/10/2008 Document Revised: 06/22/2015 Document Reviewed: 02/18/2014 Elsevier Interactive Patient Education  2017 [employer].

## 2018-04-08 ENCOUNTER — Emergency Department
Admission: EM | Admit: 2018-04-08 | Discharge: 2018-04-08 | Disposition: A | Discharge status: Other Acute Inpatient Hospital | Payer: Medicare HMO | Attending: Emergency Medicine | Admitting: Emergency Medicine

## 2018-04-08 ENCOUNTER — Emergency Department: Payer: Medicare HMO

## 2018-04-08 ENCOUNTER — Encounter: Payer: Self-pay | Admitting: Emergency Medicine

## 2018-04-08 ENCOUNTER — Other Ambulatory Visit: Payer: Self-pay

## 2018-04-08 DIAGNOSIS — R000 Tachycardia, unspecified: Secondary | ICD-10-CM | POA: Diagnosis not present

## 2018-04-08 DIAGNOSIS — R609 Edema, unspecified: Secondary | ICD-10-CM | POA: Diagnosis not present

## 2018-04-08 DIAGNOSIS — Y939 Activity, unspecified: Secondary | ICD-10-CM | POA: Diagnosis not present

## 2018-04-08 DIAGNOSIS — J811 Chronic pulmonary edema: Secondary | ICD-10-CM | POA: Diagnosis not present

## 2018-04-08 DIAGNOSIS — S2249XA Multiple fractures of ribs, unspecified side, initial encounter for closed fracture: Secondary | ICD-10-CM | POA: Diagnosis not present

## 2018-04-08 DIAGNOSIS — J14 Pneumonia due to Hemophilus influenzae: Secondary | ICD-10-CM | POA: Diagnosis not present

## 2018-04-08 DIAGNOSIS — N179 Acute kidney failure, unspecified: Secondary | ICD-10-CM | POA: Diagnosis not present

## 2018-04-08 DIAGNOSIS — J9690 Respiratory failure, unspecified, unspecified whether with hypoxia or hypercapnia: Secondary | ICD-10-CM | POA: Diagnosis not present

## 2018-04-08 DIAGNOSIS — I340 Nonrheumatic mitral (valve) insufficiency: Secondary | ICD-10-CM | POA: Diagnosis not present

## 2018-04-08 DIAGNOSIS — N400 Enlarged prostate without lower urinary tract symptoms: Secondary | ICD-10-CM | POA: Diagnosis not present

## 2018-04-08 DIAGNOSIS — G8911 Acute pain due to trauma: Secondary | ICD-10-CM | POA: Diagnosis not present

## 2018-04-08 DIAGNOSIS — R918 Other nonspecific abnormal finding of lung field: Secondary | ICD-10-CM | POA: Diagnosis not present

## 2018-04-08 DIAGNOSIS — Y929 Unspecified place or not applicable: Secondary | ICD-10-CM | POA: Insufficient documentation

## 2018-04-08 DIAGNOSIS — B963 Hemophilus influenzae [H. influenzae] as the cause of diseases classified elsewhere: Secondary | ICD-10-CM | POA: Diagnosis not present

## 2018-04-08 DIAGNOSIS — M47812 Spondylosis without myelopathy or radiculopathy, cervical region: Secondary | ICD-10-CM | POA: Diagnosis not present

## 2018-04-08 DIAGNOSIS — Z9689 Presence of other specified functional implants: Secondary | ICD-10-CM

## 2018-04-08 DIAGNOSIS — W19XXXA Unspecified fall, initial encounter: Secondary | ICD-10-CM | POA: Diagnosis not present

## 2018-04-08 DIAGNOSIS — J90 Pleural effusion, not elsewhere classified: Secondary | ICD-10-CM | POA: Diagnosis not present

## 2018-04-08 DIAGNOSIS — R52 Pain, unspecified: Secondary | ICD-10-CM | POA: Diagnosis not present

## 2018-04-08 DIAGNOSIS — M549 Dorsalgia, unspecified: Secondary | ICD-10-CM | POA: Diagnosis not present

## 2018-04-08 DIAGNOSIS — J9600 Acute respiratory failure, unspecified whether with hypoxia or hypercapnia: Secondary | ICD-10-CM | POA: Diagnosis not present

## 2018-04-08 DIAGNOSIS — R845 Abnormal microbiological findings in specimens from respiratory organs and thorax: Secondary | ICD-10-CM | POA: Diagnosis not present

## 2018-04-08 DIAGNOSIS — R509 Fever, unspecified: Secondary | ICD-10-CM | POA: Diagnosis not present

## 2018-04-08 DIAGNOSIS — I4891 Unspecified atrial fibrillation: Secondary | ICD-10-CM | POA: Diagnosis not present

## 2018-04-08 DIAGNOSIS — I248 Other forms of acute ischemic heart disease: Secondary | ICD-10-CM | POA: Diagnosis not present

## 2018-04-08 DIAGNOSIS — R7989 Other specified abnormal findings of blood chemistry: Secondary | ICD-10-CM | POA: Insufficient documentation

## 2018-04-08 DIAGNOSIS — S225XXA Flail chest, initial encounter for closed fracture: Secondary | ICD-10-CM | POA: Diagnosis not present

## 2018-04-08 DIAGNOSIS — F17210 Nicotine dependence, cigarettes, uncomplicated: Secondary | ICD-10-CM | POA: Diagnosis not present

## 2018-04-08 DIAGNOSIS — J439 Emphysema, unspecified: Secondary | ICD-10-CM | POA: Diagnosis not present

## 2018-04-08 DIAGNOSIS — Z452 Encounter for adjustment and management of vascular access device: Secondary | ICD-10-CM | POA: Diagnosis not present

## 2018-04-08 DIAGNOSIS — A419 Sepsis, unspecified organism: Secondary | ICD-10-CM | POA: Diagnosis not present

## 2018-04-08 DIAGNOSIS — S272XXA Traumatic hemopneumothorax, initial encounter: Secondary | ICD-10-CM | POA: Diagnosis not present

## 2018-04-08 DIAGNOSIS — S2241XA Multiple fractures of ribs, right side, initial encounter for closed fracture: Secondary | ICD-10-CM

## 2018-04-08 DIAGNOSIS — J189 Pneumonia, unspecified organism: Secondary | ICD-10-CM | POA: Diagnosis not present

## 2018-04-08 DIAGNOSIS — S01311A Laceration without foreign body of right ear, initial encounter: Secondary | ICD-10-CM | POA: Insufficient documentation

## 2018-04-08 DIAGNOSIS — S3992XA Unspecified injury of lower back, initial encounter: Secondary | ICD-10-CM | POA: Diagnosis not present

## 2018-04-08 DIAGNOSIS — I5021 Acute systolic (congestive) heart failure: Secondary | ICD-10-CM | POA: Diagnosis not present

## 2018-04-08 DIAGNOSIS — R778 Other specified abnormalities of plasma proteins: Secondary | ICD-10-CM | POA: Diagnosis not present

## 2018-04-08 DIAGNOSIS — D62 Acute posthemorrhagic anemia: Secondary | ICD-10-CM | POA: Diagnosis not present

## 2018-04-08 DIAGNOSIS — M5489 Other dorsalgia: Secondary | ICD-10-CM | POA: Diagnosis not present

## 2018-04-08 DIAGNOSIS — W11XXXA Fall on and from ladder, initial encounter: Secondary | ICD-10-CM | POA: Insufficient documentation

## 2018-04-08 DIAGNOSIS — S29009A Unspecified injury of muscle and tendon of unspecified wall of thorax, initial encounter: Secondary | ICD-10-CM | POA: Diagnosis not present

## 2018-04-08 DIAGNOSIS — I491 Atrial premature depolarization: Secondary | ICD-10-CM | POA: Diagnosis not present

## 2018-04-08 DIAGNOSIS — I5022 Chronic systolic (congestive) heart failure: Secondary | ICD-10-CM | POA: Diagnosis not present

## 2018-04-08 DIAGNOSIS — S2020XA Contusion of thorax, unspecified, initial encounter: Secondary | ICD-10-CM | POA: Diagnosis not present

## 2018-04-08 DIAGNOSIS — S3993XA Unspecified injury of pelvis, initial encounter: Secondary | ICD-10-CM | POA: Diagnosis not present

## 2018-04-08 DIAGNOSIS — I517 Cardiomegaly: Secondary | ICD-10-CM | POA: Diagnosis not present

## 2018-04-08 DIAGNOSIS — R579 Shock, unspecified: Secondary | ICD-10-CM | POA: Diagnosis not present

## 2018-04-08 DIAGNOSIS — R570 Cardiogenic shock: Secondary | ICD-10-CM | POA: Diagnosis not present

## 2018-04-08 DIAGNOSIS — I429 Cardiomyopathy, unspecified: Secondary | ICD-10-CM | POA: Diagnosis not present

## 2018-04-08 DIAGNOSIS — R0902 Hypoxemia: Secondary | ICD-10-CM | POA: Diagnosis not present

## 2018-04-08 DIAGNOSIS — S299XXA Unspecified injury of thorax, initial encounter: Secondary | ICD-10-CM | POA: Diagnosis present

## 2018-04-08 DIAGNOSIS — S199XXA Unspecified injury of neck, initial encounter: Secondary | ICD-10-CM | POA: Diagnosis not present

## 2018-04-08 DIAGNOSIS — I499 Cardiac arrhythmia, unspecified: Secondary | ICD-10-CM | POA: Diagnosis not present

## 2018-04-08 DIAGNOSIS — J942 Hemothorax: Secondary | ICD-10-CM | POA: Diagnosis not present

## 2018-04-08 DIAGNOSIS — S0990XA Unspecified injury of head, initial encounter: Secondary | ICD-10-CM | POA: Insufficient documentation

## 2018-04-08 DIAGNOSIS — Z66 Do not resuscitate: Secondary | ICD-10-CM | POA: Diagnosis not present

## 2018-04-08 DIAGNOSIS — E785 Hyperlipidemia, unspecified: Secondary | ICD-10-CM | POA: Insufficient documentation

## 2018-04-08 DIAGNOSIS — I5020 Unspecified systolic (congestive) heart failure: Secondary | ICD-10-CM | POA: Diagnosis not present

## 2018-04-08 DIAGNOSIS — M5137 Other intervertebral disc degeneration, lumbosacral region: Secondary | ICD-10-CM | POA: Diagnosis not present

## 2018-04-08 DIAGNOSIS — R0989 Other specified symptoms and signs involving the circulatory and respiratory systems: Secondary | ICD-10-CM | POA: Diagnosis not present

## 2018-04-08 DIAGNOSIS — G8918 Other acute postprocedural pain: Secondary | ICD-10-CM | POA: Diagnosis not present

## 2018-04-08 DIAGNOSIS — Z4682 Encounter for fitting and adjustment of non-vascular catheter: Secondary | ICD-10-CM | POA: Diagnosis not present

## 2018-04-08 DIAGNOSIS — Y999 Unspecified external cause status: Secondary | ICD-10-CM | POA: Diagnosis not present

## 2018-04-08 DIAGNOSIS — S3991XA Unspecified injury of abdomen, initial encounter: Secondary | ICD-10-CM | POA: Diagnosis not present

## 2018-04-08 DIAGNOSIS — F1010 Alcohol abuse, uncomplicated: Secondary | ICD-10-CM | POA: Diagnosis not present

## 2018-04-08 DIAGNOSIS — J9601 Acute respiratory failure with hypoxia: Secondary | ICD-10-CM | POA: Diagnosis not present

## 2018-04-08 DIAGNOSIS — J9819 Other pulmonary collapse: Secondary | ICD-10-CM | POA: Diagnosis not present

## 2018-04-08 DIAGNOSIS — R9431 Abnormal electrocardiogram [ECG] [EKG]: Secondary | ICD-10-CM | POA: Diagnosis not present

## 2018-04-08 DIAGNOSIS — I214 Non-ST elevation (NSTEMI) myocardial infarction: Secondary | ICD-10-CM | POA: Diagnosis not present

## 2018-04-08 DIAGNOSIS — T68XXXA Hypothermia, initial encounter: Secondary | ICD-10-CM | POA: Diagnosis not present

## 2018-04-08 DIAGNOSIS — R0603 Acute respiratory distress: Secondary | ICD-10-CM | POA: Diagnosis not present

## 2018-04-08 DIAGNOSIS — R42 Dizziness and giddiness: Secondary | ICD-10-CM | POA: Diagnosis not present

## 2018-04-08 DIAGNOSIS — I493 Ventricular premature depolarization: Secondary | ICD-10-CM | POA: Diagnosis not present

## 2018-04-08 DIAGNOSIS — D696 Thrombocytopenia, unspecified: Secondary | ICD-10-CM | POA: Diagnosis not present

## 2018-04-08 DIAGNOSIS — I426 Alcoholic cardiomyopathy: Secondary | ICD-10-CM | POA: Diagnosis not present

## 2018-04-08 DIAGNOSIS — M5134 Other intervertebral disc degeneration, thoracic region: Secondary | ICD-10-CM | POA: Diagnosis not present

## 2018-04-08 DIAGNOSIS — F1910 Other psychoactive substance abuse, uncomplicated: Secondary | ICD-10-CM | POA: Diagnosis not present

## 2018-04-08 DIAGNOSIS — J9811 Atelectasis: Secondary | ICD-10-CM | POA: Diagnosis not present

## 2018-04-08 DIAGNOSIS — M4802 Spinal stenosis, cervical region: Secondary | ICD-10-CM | POA: Diagnosis not present

## 2018-04-08 DIAGNOSIS — S271XXA Traumatic hemothorax, initial encounter: Secondary | ICD-10-CM | POA: Diagnosis not present

## 2018-04-08 DIAGNOSIS — Z515 Encounter for palliative care: Secondary | ICD-10-CM | POA: Diagnosis not present

## 2018-04-08 DIAGNOSIS — R6521 Severe sepsis with septic shock: Secondary | ICD-10-CM | POA: Diagnosis not present

## 2018-04-08 DIAGNOSIS — I349 Nonrheumatic mitral valve disorder, unspecified: Secondary | ICD-10-CM | POA: Diagnosis not present

## 2018-04-08 DIAGNOSIS — J939 Pneumothorax, unspecified: Secondary | ICD-10-CM | POA: Diagnosis not present

## 2018-04-08 DIAGNOSIS — I361 Nonrheumatic tricuspid (valve) insufficiency: Secondary | ICD-10-CM | POA: Diagnosis not present

## 2018-04-08 DIAGNOSIS — I700 Atherosclerosis of aorta: Secondary | ICD-10-CM | POA: Diagnosis not present

## 2018-04-08 DIAGNOSIS — R0789 Other chest pain: Secondary | ICD-10-CM | POA: Diagnosis not present

## 2018-04-08 DIAGNOSIS — Z9911 Dependence on respirator [ventilator] status: Secondary | ICD-10-CM | POA: Diagnosis not present

## 2018-04-08 DIAGNOSIS — T82898A Other specified complication of vascular prosthetic devices, implants and grafts, initial encounter: Secondary | ICD-10-CM | POA: Diagnosis not present

## 2018-04-08 DIAGNOSIS — Z01810 Encounter for preprocedural cardiovascular examination: Secondary | ICD-10-CM | POA: Diagnosis not present

## 2018-04-08 DIAGNOSIS — I483 Typical atrial flutter: Secondary | ICD-10-CM | POA: Diagnosis not present

## 2018-04-08 DIAGNOSIS — J929 Pleural plaque without asbestos: Secondary | ICD-10-CM | POA: Diagnosis not present

## 2018-04-08 LAB — CBC WITH DIFFERENTIAL/PLATELET
Abs Immature Granulocytes: 0.04 10*3/uL (ref 0.00–0.07)
Basophils Absolute: 0 10*3/uL (ref 0.0–0.1)
Basophils Relative: 0 %
Eosinophils Absolute: 0 10*3/uL (ref 0.0–0.5)
Eosinophils Relative: 0 %
HCT: 39.7 % (ref 39.0–52.0)
Hemoglobin: 13.2 g/dL (ref 13.0–17.0)
Immature Granulocytes: 0 %
Lymphocytes Relative: 8 %
Lymphs Abs: 0.8 10*3/uL (ref 0.7–4.0)
MCH: 32.8 pg (ref 26.0–34.0)
MCHC: 33.2 g/dL (ref 30.0–36.0)
MCV: 98.5 fL (ref 80.0–100.0)
Monocytes Absolute: 0.7 10*3/uL (ref 0.1–1.0)
Monocytes Relative: 7 %
Neutro Abs: 8.5 10*3/uL — ABNORMAL HIGH (ref 1.7–7.7)
Neutrophils Relative %: 85 %
Platelets: 141 10*3/uL — ABNORMAL LOW (ref 150–400)
RBC: 4.03 MIL/uL — ABNORMAL LOW (ref 4.22–5.81)
RDW: 12.7 % (ref 11.5–15.5)
WBC: 10 10*3/uL (ref 4.0–10.5)
nRBC: 0 % (ref 0.0–0.2)

## 2018-04-08 LAB — TYPE AND SCREEN
ABO/RH(D): O POS
Antibody Screen: NEGATIVE

## 2018-04-08 LAB — COMPREHENSIVE METABOLIC PANEL
ALT: 170 U/L — ABNORMAL HIGH (ref 0–44)
AST: 504 U/L — ABNORMAL HIGH (ref 15–41)
Albumin: 3.9 g/dL (ref 3.5–5.0)
Alkaline Phosphatase: 33 U/L — ABNORMAL LOW (ref 38–126)
Anion gap: 16 — ABNORMAL HIGH (ref 5–15)
BUN: <5 mg/dL — ABNORMAL LOW (ref 8–23)
CO2: 20 mmol/L — ABNORMAL LOW (ref 22–32)
Calcium: 8.5 mg/dL — ABNORMAL LOW (ref 8.9–10.3)
Chloride: 98 mmol/L (ref 98–111)
Creatinine, Ser: 0.72 mg/dL (ref 0.61–1.24)
GFR calc Af Amer: >60 mL/min (ref >60)
GFR calc non Af Amer: >60 mL/min (ref >60)
Glucose, Bld: 99 mg/dL (ref 70–99)
Potassium: 3.4 mmol/L — ABNORMAL LOW (ref 3.5–5.1)
Sodium: 134 mmol/L — ABNORMAL LOW (ref 135–145)
Total Bilirubin: 1.1 mg/dL (ref 0.3–1.2)
Total Protein: 6.8 g/dL (ref 6.5–8.1)

## 2018-04-08 LAB — TROPONIN I: Troponin I: 0.2 ng/mL (ref <0.03)

## 2018-04-08 MED ORDER — SODIUM CHLORIDE 0.9 % IV BOLUS
1000.0000 mL | Freq: Once | INTRAVENOUS | Status: AC
  Administered 2018-04-08: 1000 mL via INTRAVENOUS

## 2018-04-08 MED ORDER — LIDOCAINE HCL (PF) 1 % IJ SOLN
INTRAMUSCULAR | Status: AC
  Administered 2018-04-08: 15 mL
  Filled 2018-04-08: qty 15

## 2018-04-08 MED ORDER — IOHEXOL 300 MG/ML  SOLN
100.0000 mL | Freq: Once | INTRAMUSCULAR | Status: AC | PRN
  Administered 2018-04-08: 100 mL via INTRAVENOUS

## 2018-04-08 MED ORDER — FENTANYL CITRATE (PF) 100 MCG/2ML IJ SOLN
100.0000 ug | Freq: Once | INTRAMUSCULAR | Status: AC
  Administered 2018-04-08: 100 ug via INTRAVENOUS
  Filled 2018-04-08: qty 2

## 2018-04-08 MED ORDER — OXYCODONE-ACETAMINOPHEN 5-325 MG PO TABS
1.0000 | ORAL_TABLET | Freq: Once | ORAL | Status: AC
  Administered 2018-04-08: 1 via ORAL
  Filled 2018-04-08: qty 1

## 2018-04-08 NOTE — ED Notes (Signed)
Lab called with a critical high troponin of 0.20 Dr. Don Perking is aware.

## 2018-04-08 NOTE — ED Triage Notes (Addendum)
Pt in via [facility], reports missing last step as he was getting off a ladder and falling onto back.  Pt with complaints of upper back pain.  Pt denies hitting head, however, leaves noted to posterior head and upper back and superficial laceration noted to right ear.  Pt also denies LOC, denies blood thinners.  Pt A/Ox4, vitals WDL, NAD noted at this time.

## 2018-04-08 NOTE — ED Provider Notes (Signed)
[facility] Emergency Department Provider Note  ____________________________________________  Time seen: Approximately 11:31 PM  I have reviewed the triage vital signs and the nursing notes.   HISTORY  Chief Complaint Fall    HPI Edgar Cruz is a 68 y.o. male with a history of alcohol dependence, presents to the emergency department after a fall while trimming trees.  Patient was using a ladder when he lost his balance and fell approximately 4 feet onto the right side of his back and right side.  Patient denies hitting his head or his neck.  He has had shortness of breath and chest tightness as well as thoracic back pain. He denies abdominal pain.  No nausea or vomiting.  Patient reports that he was lying on the ground for "sometime" before he finally called EMS.  Patient denies blood thinner use.        Past Medical History:  Diagnosis Date  . Alcohol dependence (HCC)   . Alcoholic hepatitis   . Hyperlipidemia     Patient Active Problem List   Diagnosis Date Noted  . Advance directive discussed with patient 06/25/2016  . Hyperlipidemia   . Alcoholic hepatitis   . Alcohol dependence (HCC)     Past Surgical History:  Procedure Laterality Date  . VASECTOMY  1975    Prior to Admission medications   Medication Sig Start Date End Date Taking? Authorizing Provider  albuterol (PROVENTIL HFA;VENTOLIN HFA) 108 (90 Base) MCG/ACT inhaler Inhale 2 puffs into the lungs every 6 (six) hours as needed for wheezing or shortness of breath. Patient not taking: Reported on 07/04/2017 04/01/17   Olevia PerchesJohnson, Megan P, DO  naproxen (NAPROSYN) 500 MG tablet Take 1 tablet (500 mg total) by mouth 2 (two) times daily with a meal. Patient not taking: Reported on 07/04/2017 04/01/17   Dorcas CarrowJohnson, Megan P, DO    Allergies Patient has no known allergies.  Family History  Problem Relation Age of Onset  . Arthritis Mother   . Heart disease Brother   . Parkinson's disease Sister      Social History Social History   Tobacco Use  . Smoking status: Current Every Day Smoker    Packs/day: 1.00    Types: Cigarettes  . Smokeless tobacco: Never Used  Substance Use Topics  . Alcohol use: Yes  . Drug use: No     Review of Systems  Constitutional: No fever/chills Eyes: No visual changes. No discharge ENT: No upper respiratory complaints. Cardiovascular: Patient has chest tightness.  Respiratory: no cough. Patient has SOB.  Gastrointestinal: No abdominal pain.  No nausea, no vomiting.  No diarrhea.  No constipation. Genitourinary: Negative for dysuria. No hematuria Musculoskeletal: Patient has back pain.  Skin: Negative for rash, abrasions, lacerations, ecchymosis. Neurological: Negative for headaches, focal weakness or numbness.  ____________________________________________   PHYSICAL EXAM:  VITAL SIGNS: ED Triage Vitals  Enc Vitals Group     BP 04/08/18 1822 114/81     Pulse Rate 04/08/18 1822 97     Resp 04/08/18 1822 16     Temp 04/08/18 1822 97.8 F (36.6 C)     Temp src --      SpO2 04/08/18 1822 95 %     Weight 04/08/18 1823 150 lb (68 kg)     Height 04/08/18 1823 5\' 11"  (1.803 m)     Head Circumference --      Peak Flow --      Pain Score 04/08/18 1822 8     Pain  Loc --      Pain Edu? --      Excl. in GC? --      Constitutional: Alert and oriented. Well appearing and in no acute distress. Eyes: Conjunctivae are normal. PERRL. EOMI. Head: Atraumatic. ENT:.      Mouth/Throat: Mucous membranes are moist.  Neck: No stridor.  No cervical spine tenderness to palpation. Hematological/Lymphatic/Immunilogical: No cervical lymphadenopathy. Cardiovascular: Normal rate, regular rhythm. Normal S1 and S2.  Good peripheral circulation. Respiratory: Normal respiratory effort without tachypnea or retractions. Lungs CTAB. Good air entry to the bases with no decreased or absent breath sounds. Gastrointestinal: Bowel sounds 4 quadrants. Soft and  nontender to palpation. No guarding or rigidity. No palpable masses. No distention. No CVA tenderness. Musculoskeletal: Patient has tenderness to palpation along the right anterior chest wall. Neurologic:  Normal speech and language. No gross focal neurologic deficits are appreciated.  Skin:  Skin is warm, dry and intact. No rash noted. Psychiatric: Mood and affect are normal. Speech and behavior are normal. Patient exhibits appropriate insight and judgement.   ____________________________________________   LABS (all labs ordered are listed, but only abnormal results are displayed)  Labs Reviewed  CBC WITH DIFFERENTIAL/PLATELET - Abnormal; Notable for the following components:      Result Value   RBC 4.03 (*)    Platelets 141 (*)    Neutro Abs 8.5 (*)    All other components within normal limits  COMPREHENSIVE METABOLIC PANEL - Abnormal; Notable for the following components:   Sodium 134 (*)    Potassium 3.4 (*)    CO2 20 (*)    BUN <5 (*)    Calcium 8.5 (*)    AST 504 (*)    ALT 170 (*)    Alkaline Phosphatase 33 (*)    Anion gap 16 (*)    All other components within normal limits  TROPONIN I - Abnormal; Notable for the following components:   Troponin I 0.20 (*)    All other components within normal limits  TYPE AND SCREEN   ____________________________________________  EKG   ____________________________________________  RADIOLOGY I personally viewed and evaluated these images as part of my medical decision making, as well as reviewing the written report by the radiologist.  Dg Thoracic Spine 2 View  Addendum Date: 04/08/2018   ADDENDUM REPORT: 04/08/2018 20:38 ADDENDUM: Suspicion of pneumothorax and hemothorax on the right Study discussed by telephone with PA [employer] on 04/08/2018 at 2025 hours. Electronically Signed   By: Odessa Fleming M.D.   On: 04/08/2018 20:38   Result Date: 04/08/2018 CLINICAL DATA:  68 year old male status post fall from ladder. EXAM: THORACIC  SPINE 2 VIEWS COMPARISON:  Chest radiographs 03/20/2017. FINDINGS: Normal thoracic segmentation. Visible cervicothoracic junction alignment appears normal. Thoracic vertebral height and alignment appears stable with relatively preserved disc spaces. However, there are numerous right posterior rib fractures, at least ribs 3 through 10. The 3rd through 6th rib fractures are displaced. There is abnormal supraclavicular gas. No pneumothorax is evident, although there is evidence of a small right pleural effusion. Mediastinal contours appear stable. The left lung appears clear. No left rib fracture identified. IMPRESSION: 1. Numerous right posterior rib fractures, at least ribs 3rd through 10. 2. Occult right pneumothorax suspected, with gas in the supraclavicular fossa. Small right pleural effusion is evident. 3. No acute fracture or listhesis identified in the thoracic spine. Electronically Signed: By: Odessa Fleming M.D. On: 04/08/2018 20:14   Dg Lumbar Spine 2-3 Views  Result  Date: 04/08/2018 CLINICAL DATA:  68 year old male status post fall from ladder. EXAM: LUMBAR SPINE - 2-3 VIEW COMPARISON:  Thoracic radiographs reported separately. FINDINGS: Normal lumbar segmentation. The lumbar and visible lower thoracic vertebrae appear intact. SI joints appear normal. Grossly intact visible sacrum. Right posterior 10th rib fracture redemonstrated. Aortoiliac calcified atherosclerosis. Negative visible bowel gas pattern. IMPRESSION: 1. No acute osseous abnormality identified in the lumbar spine. 2. Right 10th posterior rib fracture redemonstrated. 3.  Aortic Atherosclerosis (ICD10-I70.0). Electronically Signed   By: Odessa Fleming M.D.   On: 04/08/2018 20:15   Ct Head Wo Contrast  Result Date: 04/08/2018 CLINICAL DATA:  Status post fall onto back, with laceration at the right ear. Concern for head or cervical spine injury. Initial encounter. EXAM: CT HEAD WITHOUT CONTRAST CT CERVICAL SPINE WITHOUT CONTRAST TECHNIQUE: Multidetector  CT imaging of the head and cervical spine was performed following the standard protocol without intravenous contrast. Multiplanar CT image reconstructions of the cervical spine were also generated. COMPARISON:  None. FINDINGS: CT HEAD FINDINGS Brain: No evidence of acute infarction, hemorrhage, hydrocephalus, extra-axial collection or mass lesion / mass effect. Prominence of the ventricles and sulci reflects mild cortical volume loss. Scattered periventricular and subcortical white matter change likely reflects small vessel ischemic microangiopathy. Small chronic lacunar infarcts are noted at the left basal ganglia. The brainstem and fourth ventricle are within normal limits. The cerebral hemispheres demonstrate grossly normal gray-white differentiation. No mass effect or midline shift is seen. Vascular: No hyperdense vessel or unexpected calcification. Skull: There is no evidence of fracture; visualized osseous structures are unremarkable in appearance. Sinuses/Orbits: The visualized portions of the orbits are within normal limits. The paranasal sinuses and mastoid air cells are well-aerated. Other: No significant soft tissue abnormalities are seen. CT CERVICAL SPINE FINDINGS Alignment: Normal. Skull base and vertebrae: No acute fracture along the cervical spine. No primary bone lesion or focal pathologic process. Soft tissues and spinal canal: No prevertebral fluid or swelling. No visible canal hematoma. Disc levels: Multilevel disc space narrowing is noted at the lower cervical spine, with scattered anterior and posterior disc osteophyte complexes. The bony foramina are grossly unremarkable. Upper chest: There are mildly displaced and comminuted fractures of the right first through third ribs, with a small to moderate associated hemopneumothorax, and overlying scattered soft tissue air. Other: Calcification is noted at the carotid bifurcations bilaterally, more prominent on the right, with likely mild luminal  narrowing. A prominent multilobulated 3.3 cm mass is noted arising at the left thyroid lobe. IMPRESSION: 1. No evidence of traumatic intracranial injury. 2. No evidence of fracture or subluxation along the cervical spine. 3. Mildly displaced and comminuted fractures of the right first through third ribs, with a small to moderate associated hemopneumothorax, and overlying scattered soft tissue air. 4. Mild cortical volume loss and scattered small vessel ischemic microangiopathy. 5. Small chronic lacunar infarcts at the left basal ganglia. 6. Mild degenerative change at the lower cervical spine. 7. Calcification at the carotid bifurcations bilaterally, more prominent on the right, with likely mild luminal narrowing. Carotid ultrasound would be helpful for further evaluation, when and as deemed clinically appropriate. 8. 3.3 cm multilobulated mass at the left thyroid lobe. Recommend further evaluation with thyroid ultrasound. If patient is clinically hyperthyroid, consider nuclear medicine thyroid uptake and scan. These results were called by telephone at the time of interpretation on 04/08/2018 at 8:23 pm to [facility] PA, who verbally acknowledged these results. Electronically Signed   By: Beryle Beams.D.  On: 04/08/2018 20:28   Ct Chest W Contrast  Result Date: 04/08/2018 CLINICAL DATA:  Status post fall off ladder onto back, with upper back pain. Concern for chest or abdominal injury. Initial encounter. EXAM: CT CHEST, ABDOMEN, AND PELVIS WITH CONTRAST TECHNIQUE: Multidetector CT imaging of the chest, abdomen and pelvis was performed following the standard protocol during bolus administration of intravenous contrast. CONTRAST:  OMNIPAQUE IOHEXOL 300 MG/ML  SOLN COMPARISON:  Thoracic and lumbar spine radiographs performed earlier today at 7:53 p.m. FINDINGS: CT CHEST FINDINGS Cardiovascular: The heart remains normal in size. The thoracic aorta is grossly unremarkable. There is no evidence of aortic  injury. The great vessels are within normal limits. Mediastinum/Nodes: Diffuse coronary artery calcifications are seen. The esophagus is filled with fluid and air; this may reflect mild esophageal dysmotility. No mediastinal lymphadenopathy is seen. No pericardial effusion is identified. A 3 cm vague nodular mass is noted at the left thyroid lobe, better characterized on concurrent CT of the cervical spine. No axillary lymphadenopathy is seen. Lungs/Pleura: There is a relatively large right-sided hemopneumothorax, with partial collapse of much of the right lung. There are significantly displaced fractures of the right first through eleventh ribs, with numerous ribs fractured in multiple locations, raising potential for flail chest. The left lung appears relatively clear. Musculoskeletal: There is question of a fracture of the distal right clavicle, not well characterized on this study. The scapulae appear grossly intact. Extensive soft tissue air is seen tracking along the right side of the back. CT ABDOMEN PELVIS FINDINGS Hepatobiliary: The liver is unremarkable in appearance. The gallbladder is unremarkable in appearance. The common bile duct remains normal in caliber. Pancreas: The pancreas is within normal limits. Spleen: The spleen is unremarkable in appearance. Adrenals/Urinary Tract: The adrenal glands are unremarkable in appearance. The kidneys are within normal limits. There is no evidence of hydronephrosis. No renal or ureteral stones are identified. No perinephric stranding is seen. Stomach/Bowel: The stomach is unremarkable in appearance. The small bowel is within normal limits. The appendix is normal in caliber, without evidence of appendicitis. The colon is unremarkable in appearance. Vascular/Lymphatic: Scattered calcification is seen along the abdominal aorta and its branches. The abdominal aorta is otherwise grossly unremarkable. The inferior vena cava is grossly unremarkable. No retroperitoneal  lymphadenopathy is seen. No pelvic sidewall lymphadenopathy is identified. Reproductive: The bladder is mildly distended and grossly unremarkable. The prostate is mildly enlarged, measuring 5.2 cm in transverse dimension. Other: No additional soft tissue abnormalities are seen. Musculoskeletal: No acute osseous abnormalities are identified. The visualized musculature is unremarkable in appearance. IMPRESSION: 1. Relatively large right-sided hemopneumothorax, with partial collapse of much of the right lung. 2. Significantly displaced fractures of the right first through eleventh ribs, with numerous ribs fractured in multiple locations, raising potential for flail chest. 3. Question of fracture of the distal right clavicle, not well characterized on this study. 4. Extensive soft tissue air tracking along the right side of the back. 5. Diffuse coronary artery calcifications seen. 6. Esophagus filled with fluid and air; this may reflect mild esophageal dysmotility. 7. Mildly enlarged prostate. Aortic Atherosclerosis (ICD10-I70.0). Critical Value/emergent results were called by telephone at the time of interpretation on 04/08/2018 at 10:06 pm to [facility] PA, who verbally acknowledged these results. Electronically Signed   By: Roanna Raider M.D.   On: 04/08/2018 22:07   Ct Cervical Spine Wo Contrast  Result Date: 04/08/2018 CLINICAL DATA:  Status post fall onto back, with laceration at the right ear.  Concern for head or cervical spine injury. Initial encounter. EXAM: CT HEAD WITHOUT CONTRAST CT CERVICAL SPINE WITHOUT CONTRAST TECHNIQUE: Multidetector CT imaging of the head and cervical spine was performed following the standard protocol without intravenous contrast. Multiplanar CT image reconstructions of the cervical spine were also generated. COMPARISON:  None. FINDINGS: CT HEAD FINDINGS Brain: No evidence of acute infarction, hemorrhage, hydrocephalus, extra-axial collection or mass lesion / mass effect.  Prominence of the ventricles and sulci reflects mild cortical volume loss. Scattered periventricular and subcortical white matter change likely reflects small vessel ischemic microangiopathy. Small chronic lacunar infarcts are noted at the left basal ganglia. The brainstem and fourth ventricle are within normal limits. The cerebral hemispheres demonstrate grossly normal gray-white differentiation. No mass effect or midline shift is seen. Vascular: No hyperdense vessel or unexpected calcification. Skull: There is no evidence of fracture; visualized osseous structures are unremarkable in appearance. Sinuses/Orbits: The visualized portions of the orbits are within normal limits. The paranasal sinuses and mastoid air cells are well-aerated. Other: No significant soft tissue abnormalities are seen. CT CERVICAL SPINE FINDINGS Alignment: Normal. Skull base and vertebrae: No acute fracture along the cervical spine. No primary bone lesion or focal pathologic process. Soft tissues and spinal canal: No prevertebral fluid or swelling. No visible canal hematoma. Disc levels: Multilevel disc space narrowing is noted at the lower cervical spine, with scattered anterior and posterior disc osteophyte complexes. The bony foramina are grossly unremarkable. Upper chest: There are mildly displaced and comminuted fractures of the right first through third ribs, with a small to moderate associated hemopneumothorax, and overlying scattered soft tissue air. Other: Calcification is noted at the carotid bifurcations bilaterally, more prominent on the right, with likely mild luminal narrowing. A prominent multilobulated 3.3 cm mass is noted arising at the left thyroid lobe. IMPRESSION: 1. No evidence of traumatic intracranial injury. 2. No evidence of fracture or subluxation along the cervical spine. 3. Mildly displaced and comminuted fractures of the right first through third ribs, with a small to moderate associated hemopneumothorax, and  overlying scattered soft tissue air. 4. Mild cortical volume loss and scattered small vessel ischemic microangiopathy. 5. Small chronic lacunar infarcts at the left basal ganglia. 6. Mild degenerative change at the lower cervical spine. 7. Calcification at the carotid bifurcations bilaterally, more prominent on the right, with likely mild luminal narrowing. Carotid ultrasound would be helpful for further evaluation, when and as deemed clinically appropriate. 8. 3.3 cm multilobulated mass at the left thyroid lobe. Recommend further evaluation with thyroid ultrasound. If patient is clinically hyperthyroid, consider nuclear medicine thyroid uptake and scan. These results were called by telephone at the time of interpretation on 04/08/2018 at 8:23 pm to [facility] PA, who verbally acknowledged these results. Electronically Signed   By: Roanna Raider M.D.   On: 04/08/2018 20:28   Ct Abdomen Pelvis W Contrast  Result Date: 04/08/2018 CLINICAL DATA:  Status post fall off ladder onto back, with upper back pain. Concern for chest or abdominal injury. Initial encounter. EXAM: CT CHEST, ABDOMEN, AND PELVIS WITH CONTRAST TECHNIQUE: Multidetector CT imaging of the chest, abdomen and pelvis was performed following the standard protocol during bolus administration of intravenous contrast. CONTRAST:  OMNIPAQUE IOHEXOL 300 MG/ML  SOLN COMPARISON:  Thoracic and lumbar spine radiographs performed earlier today at 7:53 p.m. FINDINGS: CT CHEST FINDINGS Cardiovascular: The heart remains normal in size. The thoracic aorta is grossly unremarkable. There is no evidence of aortic injury. The great vessels are within normal limits.  Mediastinum/Nodes: Diffuse coronary artery calcifications are seen. The esophagus is filled with fluid and air; this may reflect mild esophageal dysmotility. No mediastinal lymphadenopathy is seen. No pericardial effusion is identified. A 3 cm vague nodular mass is noted at the left thyroid lobe, better  characterized on concurrent CT of the cervical spine. No axillary lymphadenopathy is seen. Lungs/Pleura: There is a relatively large right-sided hemopneumothorax, with partial collapse of much of the right lung. There are significantly displaced fractures of the right first through eleventh ribs, with numerous ribs fractured in multiple locations, raising potential for flail chest. The left lung appears relatively clear. Musculoskeletal: There is question of a fracture of the distal right clavicle, not well characterized on this study. The scapulae appear grossly intact. Extensive soft tissue air is seen tracking along the right side of the back. CT ABDOMEN PELVIS FINDINGS Hepatobiliary: The liver is unremarkable in appearance. The gallbladder is unremarkable in appearance. The common bile duct remains normal in caliber. Pancreas: The pancreas is within normal limits. Spleen: The spleen is unremarkable in appearance. Adrenals/Urinary Tract: The adrenal glands are unremarkable in appearance. The kidneys are within normal limits. There is no evidence of hydronephrosis. No renal or ureteral stones are identified. No perinephric stranding is seen. Stomach/Bowel: The stomach is unremarkable in appearance. The small bowel is within normal limits. The appendix is normal in caliber, without evidence of appendicitis. The colon is unremarkable in appearance. Vascular/Lymphatic: Scattered calcification is seen along the abdominal aorta and its branches. The abdominal aorta is otherwise grossly unremarkable. The inferior vena cava is grossly unremarkable. No retroperitoneal lymphadenopathy is seen. No pelvic sidewall lymphadenopathy is identified. Reproductive: The bladder is mildly distended and grossly unremarkable. The prostate is mildly enlarged, measuring 5.2 cm in transverse dimension. Other: No additional soft tissue abnormalities are seen. Musculoskeletal: No acute osseous abnormalities are identified. The visualized  musculature is unremarkable in appearance. IMPRESSION: 1. Relatively large right-sided hemopneumothorax, with partial collapse of much of the right lung. 2. Significantly displaced fractures of the right first through eleventh ribs, with numerous ribs fractured in multiple locations, raising potential for flail chest. 3. Question of fracture of the distal right clavicle, not well characterized on this study. 4. Extensive soft tissue air tracking along the right side of the back. 5. Diffuse coronary artery calcifications seen. 6. Esophagus filled with fluid and air; this may reflect mild esophageal dysmotility. 7. Mildly enlarged prostate. Aortic Atherosclerosis (ICD10-I70.0). Critical Value/emergent results were called by telephone at the time of interpretation on 04/08/2018 at 10:06 pm to [facility] PA, who verbally acknowledged these results. Electronically Signed   By: Roanna Raider M.D.   On: 04/08/2018 22:07   Dg Chest Port 1 View  Result Date: 04/08/2018 CLINICAL DATA:  Chest tube placement EXAM: PORTABLE CHEST 1 VIEW COMPARISON:  03/20/2017 FINDINGS: Right chest tube has been placed. Tip is at the right apex. No visible pneumothorax. Subcutaneous emphysema in the right chest wall. Multiple acute displaced rib fractures. Shallow inspiration. Heart size and pulmonary vascularity are normal. Left lung is clear and expanded. Probable nondisplaced fracture of the distal right clavicle. IMPRESSION: Right chest tube placed. No visible pneumothorax. Multiple acute displaced right rib fractures. Probable nondisplaced fracture of the distal right clavicle. Subcutaneous emphysema. Electronically Signed   By: Burman Nieves M.D.   On: 04/08/2018 23:15    ____________________________________________    PROCEDURES  Procedure(s) performed:    Procedures    Medications  oxyCODONE-acetaminophen (PERCOCET/ROXICET) 5-325 MG per tablet 1 tablet (1  tablet Oral Given 04/08/18 2004)  iohexol (OMNIPAQUE) 300  MG/ML solution 100 mL (100 mLs Intravenous Contrast Given 04/08/18 2135)  lidocaine (PF) (XYLOCAINE) 1 % injection (15 mLs  Given 04/08/18 2226)  fentaNYL (SUBLIMAZE) injection 100 mcg (100 mcg Intravenous Given 04/08/18 2222)  sodium chloride 0.9 % bolus 1,000 mL (1,000 mLs Intravenous Transfusing/Transfer 04/08/18 2330)     ____________________________________________   INITIAL IMPRESSION / ASSESSMENT AND PLAN / ED COURSE  Pertinent labs & imaging results that were available during my care of the patient were reviewed by me and considered in my medical decision making (see chart for details).  Review of the [place] CSRS was performed in accordance of the [facility] prior to dispensing any controlled drugs.           Assessment and plan Fall Patient presents to the emergency department after a fall from a ladder of approximately 4 feet.  Dr. Margo Aye was concerned about potential hemopneumothorax visualized on thoracic spine film.  Patient was transitioned to main side of the emergency department for further care and management.  Attending, Dr. Don Perking assumed patient care    ____________________________________________  FINAL CLINICAL IMPRESSION(S) / ED DIAGNOSES  Final diagnoses:  Fall, initial encounter  Hemopneumothorax on right  Closed fracture of multiple ribs of right side, initial encounter  Elevated troponin  Demand ischemia of myocardium (HCC)      NEW MEDICATIONS STARTED DURING THIS VISIT:  ED Discharge Orders    None          This chart was dictated using voice recognition software/Dragon. Despite best efforts to proofread, errors can occur which can change the meaning. Any change was purely unintentional.    Orvil Feil, PA-C 04/08/18 [id number]    Minna Antis, MD 04/08/18 [phone]

## 2018-04-08 NOTE — ED Notes (Signed)
Dr. Don Perking is at bedside to insert a chest tube.

## 2018-04-08 NOTE — ED Provider Notes (Signed)
[facility] Emergency Department Provider Note  ____________________________________________  Time seen: Approximately 11:00 PM  I have reviewed the triage vital signs and the nursing notes.   HISTORY  Chief Complaint Fall   HPI Edgar Cruz is a 68 y.o. male with a history of alcohol abuse who presents for evaluation of trauma.  Patient was cutting limbs from a tree when he fell from a 4 foot ladder onto the grass.  He fell onto his right side.  He denies LOC.  He is complaining of pain that is located on the right side of his chest only present when he coughs, sharp and nonradiating.  He denies shortness of breath.  He is not on blood thinners.  Patient reports that the fall was mechanical in nature and his slipped as he was coming down from the ladder.   Past Medical History:  Diagnosis Date   Alcohol dependence (HCC)    Alcoholic hepatitis    Hyperlipidemia     Patient Active Problem List   Diagnosis Date Noted   Advance directive discussed with patient 06/25/2016   Hyperlipidemia    Alcoholic hepatitis    Alcohol dependence (HCC)     Past Surgical History:  Procedure Laterality Date   VASECTOMY  1975    Prior to Admission medications   Medication Sig Start Date End Date Taking? Authorizing Provider  albuterol (PROVENTIL HFA;VENTOLIN HFA) 108 (90 Base) MCG/ACT inhaler Inhale 2 puffs into the lungs every 6 (six) hours as needed for wheezing or shortness of breath. Patient not taking: Reported on 07/04/2017 04/01/17   Olevia Perches P, DO  naproxen (NAPROSYN) 500 MG tablet Take 1 tablet (500 mg total) by mouth 2 (two) times daily with a meal. Patient not taking: Reported on 07/04/2017 04/01/17   Dorcas Carrow, DO    Allergies Patient has no known allergies.  Family History  Problem Relation Age of Onset   Arthritis Mother    Heart disease Brother    Parkinson's disease Sister     Social History Social History   Tobacco Use     Smoking status: Current Every Day Smoker    Packs/day: 1.00    Types: Cigarettes   Smokeless tobacco: Never Used  Substance Use Topics   Alcohol use: Yes   Drug use: No    Review of Systems Constitutional: Negative for fever. Eyes: Negative for visual changes. ENT: Negative for facial injury or neck injury Cardiovascular: Negative for chest injury. Respiratory: Negative for shortness of breath. + chest wall injury. Gastrointestinal: Negative for abdominal pain or injury. Genitourinary: Negative for dysuria. Musculoskeletal: Negative for back injury, negative for arm or leg pain. Skin: Negative for laceration/abrasions. Neurological: Negative for head injury.   ____________________________________________   PHYSICAL EXAM:  VITAL SIGNS: ED Triage Vitals  Enc Vitals Group     BP 04/08/18 1822 114/81     Pulse Rate 04/08/18 1822 97     Resp 04/08/18 1822 16     Temp 04/08/18 1822 97.8 F (36.6 C)     Temp src --      SpO2 04/08/18 1822 95 %     Weight 04/08/18 1823 150 lb (68 kg)     Height 04/08/18 1823  (1.803 m)     Head Circumference --      Peak Flow --      Pain Score 04/08/18 1822 8     Pain Loc --      Pain Edu? --  Excl. in GC? --    Full spinal precautions maintained throughout the trauma exam. Constitutional: Alert and oriented. No acute distress. Does not appear intoxicated. HEENT Head: Normocephalic and atraumatic. Face: No facial bony tenderness. Stable midface Ears: No hemotympanum bilaterally. No Battle sign Eyes: No eye injury. PERRL. No raccoon eyes Nose: Nontender. No epistaxis. No rhinorrhea Mouth/Throat: Mucous membranes are moist. No oropharyngeal blood. No dental injury. Airway patent without stridor. Normal voice. Neck: no C-collar in place. No midline c-spine tenderness.  Cardiovascular: Normal rate, regular rhythm. Normal and symmetric distal pulses are present in all extremities. Pulmonary/Chest: Chest wall is stable  with tenderness to palpation over the R lateral aspect with crepitus. Normal respiratory effort.  Breath sounds diminished on the right Abdominal: Soft, nontender, non distended. Musculoskeletal: Nontender with normal full range of motion in all extremities. No deformities. No thoracic or lumbar midline spinal tenderness. Pelvis is stable. Skin: Skin is warm, dry and intact. No abrasions or contutions. Psychiatric: Speech and behavior are appropriate. Neurological: Normal speech and language. Moves all extremities to command. No gross focal neurologic deficits are appreciated.  Glascow Coma Score: 4 - Opens eyes on own 6 - Follows simple motor commands 5 - Alert and oriented GCS: 15   ____________________________________________   LABS (all labs ordered are listed, but only abnormal results are displayed)  Labs Reviewed  CBC WITH DIFFERENTIAL/PLATELET - Abnormal; Notable for the following components:      Result Value   RBC 4.03 (*)    Platelets 141 (*)    Neutro Abs 8.5 (*)    All other components within normal limits  COMPREHENSIVE METABOLIC PANEL - Abnormal; Notable for the following components:   Sodium 134 (*)    Potassium 3.4 (*)    CO2 20 (*)    BUN <5 (*)    Calcium 8.5 (*)    AST 504 (*)    ALT 170 (*)    Alkaline Phosphatase 33 (*)    Anion gap 16 (*)    All other components within normal limits  TROPONIN I - Abnormal; Notable for the following components:   Troponin I 0.20 (*)    All other components within normal limits  TYPE AND SCREEN   ____________________________________________  EKG  ED ECG REPORT I, Nita Sickle, the attending physician, personally viewed and interpreted this ECG.  21:57 -sinus tachycardia, rate of 100, deep ST depressions with T wave inversions and anterior and lateral leads with no ST elevations.  Posterior EKG: Sinus tachycardia, rate of 112, no ST elevations or depressions, diffuse T wave flattening  22:50 -normal sinus  rhythm, rate of 96, normal intervals, normal axis, no ST elevations or depressions, resolution of ST depression seen on initial EKG ____________________________________________  RADIOLOGY  I have personally reviewed the images performed during this visit and I agree with the Radiologist's read.   Interpretation by Radiologist:  Dg Thoracic Spine 2 View  Addendum Date: 04/08/2018   ADDENDUM REPORT: 04/08/2018 20:38 ADDENDUM: Suspicion of pneumothorax and hemothorax on the right Study discussed by telephone with PA JACLYN WOODS on 04/08/2018 at 2025 hours. Electronically Signed   By: Odessa Fleming M.D.   On: 04/08/2018 20:38   Result Date: 04/08/2018 CLINICAL DATA:  68 year old male status post fall from ladder. EXAM: THORACIC SPINE 2 VIEWS COMPARISON:  Chest radiographs 03/20/2017. FINDINGS: Normal thoracic segmentation. Visible cervicothoracic junction alignment appears normal. Thoracic vertebral height and alignment appears stable with relatively preserved disc spaces. However, there are numerous right posterior  rib fractures, at least ribs 3 through 10. The 3rd through 6th rib fractures are displaced. There is abnormal supraclavicular gas. No pneumothorax is evident, although there is evidence of a small right pleural effusion. Mediastinal contours appear stable. The left lung appears clear. No left rib fracture identified. IMPRESSION: 1. Numerous right posterior rib fractures, at least ribs 3rd through 10. 2. Occult right pneumothorax suspected, with gas in the supraclavicular fossa. Small right pleural effusion is evident. 3. No acute fracture or listhesis identified in the thoracic spine. Electronically Signed: By: Odessa FlemingH  Hall M.D. On: 04/08/2018 20:14   Dg Lumbar Spine 2-3 Views  Result Date: 04/08/2018 CLINICAL DATA:  68 year old male status post fall from ladder. EXAM: LUMBAR SPINE - 2-3 VIEW COMPARISON:  Thoracic radiographs reported separately. FINDINGS: Normal lumbar segmentation. The lumbar and  visible lower thoracic vertebrae appear intact. SI joints appear normal. Grossly intact visible sacrum. Right posterior 10th rib fracture redemonstrated. Aortoiliac calcified atherosclerosis. Negative visible bowel gas pattern. IMPRESSION: 1. No acute osseous abnormality identified in the lumbar spine. 2. Right 10th posterior rib fracture redemonstrated. 3.  Aortic Atherosclerosis (ICD10-I70.0). Electronically Signed   By: Odessa FlemingH  Hall M.D.   On: 04/08/2018 20:15   Ct Head Wo Contrast  Result Date: 04/08/2018 CLINICAL DATA:  Status post fall onto back, with laceration at the right ear. Concern for head or cervical spine injury. Initial encounter. EXAM: CT HEAD WITHOUT CONTRAST CT CERVICAL SPINE WITHOUT CONTRAST TECHNIQUE: Multidetector CT imaging of the head and cervical spine was performed following the standard protocol without intravenous contrast. Multiplanar CT image reconstructions of the cervical spine were also generated. COMPARISON:  None. FINDINGS: CT HEAD FINDINGS Brain: No evidence of acute infarction, hemorrhage, hydrocephalus, extra-axial collection or mass lesion / mass effect. Prominence of the ventricles and sulci reflects mild cortical volume loss. Scattered periventricular and subcortical white matter change likely reflects small vessel ischemic microangiopathy. Small chronic lacunar infarcts are noted at the left basal ganglia. The brainstem and fourth ventricle are within normal limits. The cerebral hemispheres demonstrate grossly normal gray-white differentiation. No mass effect or midline shift is seen. Vascular: No hyperdense vessel or unexpected calcification. Skull: There is no evidence of fracture; visualized osseous structures are unremarkable in appearance. Sinuses/Orbits: The visualized portions of the orbits are within normal limits. The paranasal sinuses and mastoid air cells are well-aerated. Other: No significant soft tissue abnormalities are seen. CT CERVICAL SPINE FINDINGS  Alignment: Normal. Skull base and vertebrae: No acute fracture along the cervical spine. No primary bone lesion or focal pathologic process. Soft tissues and spinal canal: No prevertebral fluid or swelling. No visible canal hematoma. Disc levels: Multilevel disc space narrowing is noted at the lower cervical spine, with scattered anterior and posterior disc osteophyte complexes. The bony foramina are grossly unremarkable. Upper chest: There are mildly displaced and comminuted fractures of the right first through third ribs, with a small to moderate associated hemopneumothorax, and overlying scattered soft tissue air. Other: Calcification is noted at the carotid bifurcations bilaterally, more prominent on the right, with likely mild luminal narrowing. A prominent multilobulated 3.3 cm mass is noted arising at the left thyroid lobe. IMPRESSION: 1. No evidence of traumatic intracranial injury. 2. No evidence of fracture or subluxation along the cervical spine. 3. Mildly displaced and comminuted fractures of the right first through third ribs, with a small to moderate associated hemopneumothorax, and overlying scattered soft tissue air. 4. Mild cortical volume loss and scattered small vessel ischemic microangiopathy. 5. Small chronic lacunar  infarcts at the left basal ganglia. 6. Mild degenerative change at the lower cervical spine. 7. Calcification at the carotid bifurcations bilaterally, more prominent on the right, with likely mild luminal narrowing. Carotid ultrasound would be helpful for further evaluation, when and as deemed clinically appropriate. 8. 3.3 cm multilobulated mass at the left thyroid lobe. Recommend further evaluation with thyroid ultrasound. If patient is clinically hyperthyroid, consider nuclear medicine thyroid uptake and scan. These results were called by telephone at the time of interpretation on 04/08/2018 at 8:23 pm to [facility] PA, who verbally acknowledged these results. Electronically  Signed   By: Roanna Raider M.D.   On: 04/08/2018 20:28   Ct Chest W Contrast  Result Date: 04/08/2018 CLINICAL DATA:  Status post fall off ladder onto back, with upper back pain. Concern for chest or abdominal injury. Initial encounter. EXAM: CT CHEST, ABDOMEN, AND PELVIS WITH CONTRAST TECHNIQUE: Multidetector CT imaging of the chest, abdomen and pelvis was performed following the standard protocol during bolus administration of intravenous contrast. CONTRAST:  OMNIPAQUE IOHEXOL 300 MG/ML  SOLN COMPARISON:  Thoracic and lumbar spine radiographs performed earlier today at 7:53 p.m. FINDINGS: CT CHEST FINDINGS Cardiovascular: The heart remains normal in size. The thoracic aorta is grossly unremarkable. There is no evidence of aortic injury. The great vessels are within normal limits. Mediastinum/Nodes: Diffuse coronary artery calcifications are seen. The esophagus is filled with fluid and air; this may reflect mild esophageal dysmotility. No mediastinal lymphadenopathy is seen. No pericardial effusion is identified. A 3 cm vague nodular mass is noted at the left thyroid lobe, better characterized on concurrent CT of the cervical spine. No axillary lymphadenopathy is seen. Lungs/Pleura: There is a relatively large right-sided hemopneumothorax, with partial collapse of much of the right lung. There are significantly displaced fractures of the right first through eleventh ribs, with numerous ribs fractured in multiple locations, raising potential for flail chest. The left lung appears relatively clear. Musculoskeletal: There is question of a fracture of the distal right clavicle, not well characterized on this study. The scapulae appear grossly intact. Extensive soft tissue air is seen tracking along the right side of the back. CT ABDOMEN PELVIS FINDINGS Hepatobiliary: The liver is unremarkable in appearance. The gallbladder is unremarkable in appearance. The common bile duct remains normal in caliber.  Pancreas: The pancreas is within normal limits. Spleen: The spleen is unremarkable in appearance. Adrenals/Urinary Tract: The adrenal glands are unremarkable in appearance. The kidneys are within normal limits. There is no evidence of hydronephrosis. No renal or ureteral stones are identified. No perinephric stranding is seen. Stomach/Bowel: The stomach is unremarkable in appearance. The small bowel is within normal limits. The appendix is normal in caliber, without evidence of appendicitis. The colon is unremarkable in appearance. Vascular/Lymphatic: Scattered calcification is seen along the abdominal aorta and its branches. The abdominal aorta is otherwise grossly unremarkable. The inferior vena cava is grossly unremarkable. No retroperitoneal lymphadenopathy is seen. No pelvic sidewall lymphadenopathy is identified. Reproductive: The bladder is mildly distended and grossly unremarkable. The prostate is mildly enlarged, measuring 5.2 cm in transverse dimension. Other: No additional soft tissue abnormalities are seen. Musculoskeletal: No acute osseous abnormalities are identified. The visualized musculature is unremarkable in appearance. IMPRESSION: 1. Relatively large right-sided hemopneumothorax, with partial collapse of much of the right lung. 2. Significantly displaced fractures of the right first through eleventh ribs, with numerous ribs fractured in multiple locations, raising potential for flail chest. 3. Question of fracture of the distal right clavicle,  not well characterized on this study. 4. Extensive soft tissue air tracking along the right side of the back. 5. Diffuse coronary artery calcifications seen. 6. Esophagus filled with fluid and air; this may reflect mild esophageal dysmotility. 7. Mildly enlarged prostate. Aortic Atherosclerosis (ICD10-I70.0). Critical Value/emergent results were called by telephone at the time of interpretation on 04/08/2018 at 10:06 pm to [facility] PA, who verbally  acknowledged these results. Electronically Signed   By: Roanna Raider M.D.   On: 04/08/2018 22:07   Ct Cervical Spine Wo Contrast  Result Date: 04/08/2018 CLINICAL DATA:  Status post fall onto back, with laceration at the right ear. Concern for head or cervical spine injury. Initial encounter. EXAM: CT HEAD WITHOUT CONTRAST CT CERVICAL SPINE WITHOUT CONTRAST TECHNIQUE: Multidetector CT imaging of the head and cervical spine was performed following the standard protocol without intravenous contrast. Multiplanar CT image reconstructions of the cervical spine were also generated. COMPARISON:  None. FINDINGS: CT HEAD FINDINGS Brain: No evidence of acute infarction, hemorrhage, hydrocephalus, extra-axial collection or mass lesion / mass effect. Prominence of the ventricles and sulci reflects mild cortical volume loss. Scattered periventricular and subcortical white matter change likely reflects small vessel ischemic microangiopathy. Small chronic lacunar infarcts are noted at the left basal ganglia. The brainstem and fourth ventricle are within normal limits. The cerebral hemispheres demonstrate grossly normal gray-white differentiation. No mass effect or midline shift is seen. Vascular: No hyperdense vessel or unexpected calcification. Skull: There is no evidence of fracture; visualized osseous structures are unremarkable in appearance. Sinuses/Orbits: The visualized portions of the orbits are within normal limits. The paranasal sinuses and mastoid air cells are well-aerated. Other: No significant soft tissue abnormalities are seen. CT CERVICAL SPINE FINDINGS Alignment: Normal. Skull base and vertebrae: No acute fracture along the cervical spine. No primary bone lesion or focal pathologic process. Soft tissues and spinal canal: No prevertebral fluid or swelling. No visible canal hematoma. Disc levels: Multilevel disc space narrowing is noted at the lower cervical spine, with scattered anterior and posterior disc  osteophyte complexes. The bony foramina are grossly unremarkable. Upper chest: There are mildly displaced and comminuted fractures of the right first through third ribs, with a small to moderate associated hemopneumothorax, and overlying scattered soft tissue air. Other: Calcification is noted at the carotid bifurcations bilaterally, more prominent on the right, with likely mild luminal narrowing. A prominent multilobulated 3.3 cm mass is noted arising at the left thyroid lobe. IMPRESSION: 1. No evidence of traumatic intracranial injury. 2. No evidence of fracture or subluxation along the cervical spine. 3. Mildly displaced and comminuted fractures of the right first through third ribs, with a small to moderate associated hemopneumothorax, and overlying scattered soft tissue air. 4. Mild cortical volume loss and scattered small vessel ischemic microangiopathy. 5. Small chronic lacunar infarcts at the left basal ganglia. 6. Mild degenerative change at the lower cervical spine. 7. Calcification at the carotid bifurcations bilaterally, more prominent on the right, with likely mild luminal narrowing. Carotid ultrasound would be helpful for further evaluation, when and as deemed clinically appropriate. 8. 3.3 cm multilobulated mass at the left thyroid lobe. Recommend further evaluation with thyroid ultrasound. If patient is clinically hyperthyroid, consider nuclear medicine thyroid uptake and scan. These results were called by telephone at the time of interpretation on 04/08/2018 at 8:23 pm to [facility] PA, who verbally acknowledged these results. Electronically Signed   By: Roanna Raider M.D.   On: 04/08/2018 20:28   Ct Abdomen Pelvis W  Contrast  Result Date: 04/08/2018 CLINICAL DATA:  Status post fall off ladder onto back, with upper back pain. Concern for chest or abdominal injury. Initial encounter. EXAM: CT CHEST, ABDOMEN, AND PELVIS WITH CONTRAST TECHNIQUE: Multidetector CT imaging of the chest, abdomen and  pelvis was performed following the standard protocol during bolus administration of intravenous contrast. CONTRAST:  OMNIPAQUE IOHEXOL 300 MG/ML  SOLN COMPARISON:  Thoracic and lumbar spine radiographs performed earlier today at 7:53 p.m. FINDINGS: CT CHEST FINDINGS Cardiovascular: The heart remains normal in size. The thoracic aorta is grossly unremarkable. There is no evidence of aortic injury. The great vessels are within normal limits. Mediastinum/Nodes: Diffuse coronary artery calcifications are seen. The esophagus is filled with fluid and air; this may reflect mild esophageal dysmotility. No mediastinal lymphadenopathy is seen. No pericardial effusion is identified. A 3 cm vague nodular mass is noted at the left thyroid lobe, better characterized on concurrent CT of the cervical spine. No axillary lymphadenopathy is seen. Lungs/Pleura: There is a relatively large right-sided hemopneumothorax, with partial collapse of much of the right lung. There are significantly displaced fractures of the right first through eleventh ribs, with numerous ribs fractured in multiple locations, raising potential for flail chest. The left lung appears relatively clear. Musculoskeletal: There is question of a fracture of the distal right clavicle, not well characterized on this study. The scapulae appear grossly intact. Extensive soft tissue air is seen tracking along the right side of the back. CT ABDOMEN PELVIS FINDINGS Hepatobiliary: The liver is unremarkable in appearance. The gallbladder is unremarkable in appearance. The common bile duct remains normal in caliber. Pancreas: The pancreas is within normal limits. Spleen: The spleen is unremarkable in appearance. Adrenals/Urinary Tract: The adrenal glands are unremarkable in appearance. The kidneys are within normal limits. There is no evidence of hydronephrosis. No renal or ureteral stones are identified. No perinephric stranding is seen. Stomach/Bowel: The stomach is  unremarkable in appearance. The small bowel is within normal limits. The appendix is normal in caliber, without evidence of appendicitis. The colon is unremarkable in appearance. Vascular/Lymphatic: Scattered calcification is seen along the abdominal aorta and its branches. The abdominal aorta is otherwise grossly unremarkable. The inferior vena cava is grossly unremarkable. No retroperitoneal lymphadenopathy is seen. No pelvic sidewall lymphadenopathy is identified. Reproductive: The bladder is mildly distended and grossly unremarkable. The prostate is mildly enlarged, measuring 5.2 cm in transverse dimension. Other: No additional soft tissue abnormalities are seen. Musculoskeletal: No acute osseous abnormalities are identified. The visualized musculature is unremarkable in appearance. IMPRESSION: 1. Relatively large right-sided hemopneumothorax, with partial collapse of much of the right lung. 2. Significantly displaced fractures of the right first through eleventh ribs, with numerous ribs fractured in multiple locations, raising potential for flail chest. 3. Question of fracture of the distal right clavicle, not well characterized on this study. 4. Extensive soft tissue air tracking along the right side of the back. 5. Diffuse coronary artery calcifications seen. 6. Esophagus filled with fluid and air; this may reflect mild esophageal dysmotility. 7. Mildly enlarged prostate. Aortic Atherosclerosis (ICD10-I70.0). Critical Value/emergent results were called by telephone at the time of interpretation on 04/08/2018 at 10:06 pm to [facility] PA, who verbally acknowledged these results. Electronically Signed   By: Roanna Raider M.D.   On: 04/08/2018 22:07     ____________________________________________   PROCEDURES  Procedure(s) performed:yes CHEST TUBE INSERTION Date/Time: 04/08/2018 11:05 PM Performed by: Nita Sickle, MD Authorized by: Nita Sickle, MD   Consent:  Consent obtained:   Written   Consent given by:  Patient   Risks discussed:  Bleeding, incomplete drainage, nerve damage, damage to surrounding structures, infection and pain Pre-procedure details:    Skin preparation:  Betadine   Preparation: Patient was prepped and draped in the usual sterile fashion   Sedation:    Sedation type:  Anxiolysis Anesthesia (see MAR for exact dosages):    Anesthesia method:  Local infiltration   Local anesthetic:  Lidocaine 1% WITH epi Procedure details:    Placement location:  R lateral   Scalpel size:  11   Tube size (Fr):  32   Dissection instrument:  Kelly clamp   Ultrasound guidance: no     Tension pneumothorax: no     Tube connected to:  Heimlich valve   Drainage characteristics:  Bloody   Suture material:  0 silk   Dressing:  4x4 sterile gauze and petrolatum-impregnated gauze Post-procedure details:    Post-insertion x-ray findings: tube in good position     Patient tolerance of procedure:  Tolerated well, no immediate complications   Critical Care performed: yes  CRITICAL CARE Performed by: Nita Sickle  ?  Total critical care time: 40 min  Critical care time was exclusive of separately billable procedures and treating other patients.  Critical care was necessary to treat or prevent imminent or life-threatening deterioration.  Critical care was time spent personally by me on the following activities: development of treatment plan with patient and/or surrogate as well as nursing, discussions with consultants, evaluation of patient's response to treatment, examination of patient, obtaining history from patient or surrogate, ordering and performing treatments and interventions, ordering and review of laboratory studies, ordering and review of radiographic studies, pulse oximetry and re-evaluation of patient's condition.  ____________________________________________   INITIAL IMPRESSION / ASSESSMENT AND PLAN / ED COURSE   68 y.o. male with a history  of alcohol abuse who presents for evaluation of trauma from 4 foot ladder onto the grass.  Patient arrives neurologically intact.  Initial imaging concerning for several rib fractures with possible pneumothorax.  Patient was sent for CT of the chest and abdomen and pelvis.  CT confirmed right first through 11 rib fractures with right-sided hemopneumothorax.  No other injuries seen on C-spine, head, abdomen or pelvis.  Patient hemodynamically stable with no clinical evidence of tension pneumothorax.  32 French chest tube was placed per procedure note above with roughly 500 cc of blood.  Patient tolerated the procedure well and remained hemodynamically stable.  Patient is labs show a troponin of 0.20.  Initial EKG done before chest tube was concerning for posterior MI with deep T wave inversions and ST depressions in anterior lateral leads.  A posterior EKG was done showing no ST elevation.  Patient denies having any chest pain before or after the fall.  He has no known history of coronary artery disease.  After chest tube was placed repeat EKG showed resolution of ischemic changes.  CT did show evidence of coronary artery disease.  At this time I believe the pressure from the hemopneumothorax against a heart caused a temporary occlusion of the coronary arteries which has resolved once chest tube was placed.  Discussed with Dr. Duffy Bruce, [facility] ED attending who has accepted patient as a trauma transfer.      As part of my medical decision making, I reviewed the following data within the electronic MEDICAL RECORD NUMBER Nursing notes reviewed and incorporated, Labs reviewed , EKG interpreted , Old EKG reviewed,  Old chart reviewed, Radiograph reviewed , A consult was requested and obtained from this/these consultant(s) [facility] ED, Notes from prior ED visits and [place] Controlled Substance Database    Pertinent labs & imaging results that were available during my care of the patient were reviewed by me and considered in my  medical decision making (see chart for details).    ____________________________________________   FINAL CLINICAL IMPRESSION(S) / ED DIAGNOSES  Final diagnoses:  Fall, initial encounter  Hemopneumothorax on right  Closed fracture of multiple ribs of right side, initial encounter  Elevated troponin  Demand ischemia of myocardium (HCC)      NEW MEDICATIONS STARTED DURING THIS VISIT:  ED Discharge Orders    None       Note:  This document was prepared using Dragon voice recognition software and may include unintentional dictation errors.    Don Perking, [place], MD 04/08/18 2312

## 2018-04-08 NOTE — ED Notes (Signed)
Pt just left the ED to be transferred to [facility] via [facility].

## 2018-04-08 NOTE — ED Notes (Signed)
Pt went to CT

## 2018-04-08 NOTE — ED Notes (Signed)
Dr. Don Perking finished placing the chest tube on the Pt. Pt tolerated the procedure well and states that feels better.

## 2018-04-08 NOTE — ED Notes (Signed)
Dr. Don Perking started to insert the chest tube on the Pt.

## 2018-04-24 MED ORDER — GENERIC EXTERNAL MEDICATION
0.00 | Status: DC
Start: ? — End: 2018-04-24

## 2018-04-24 MED ORDER — LORAZEPAM 2 MG/ML IJ SOLN
1.00 | INTRAMUSCULAR | Status: DC
Start: ? — End: 2018-04-24

## 2018-04-29 DEATH — deceased

## 2018-07-06 ENCOUNTER — Telehealth: Payer: Self-pay

## 2018-07-06 NOTE — Telephone Encounter (Signed)
Patient scheduled for an AWV on 07/08/2018 with [facility], Due to Covid-19 pandemic this is unable to be done in office, called patient to see if they are able to do this virtually/telephonically.unable to leave a message.  Direct call back [phone]

## 2018-07-08 ENCOUNTER — Ambulatory Visit: Payer: Medicare HMO

## 2021-02-20 IMAGING — DX PORTABLE CHEST - 1 VIEW
1 series · 1 of 1 positions shown · non-contrast
Comparison: 03/20/2017

CLINICAL DATA: Chest tube placement

EXAM:
PORTABLE CHEST 1 VIEW

[chest ap]
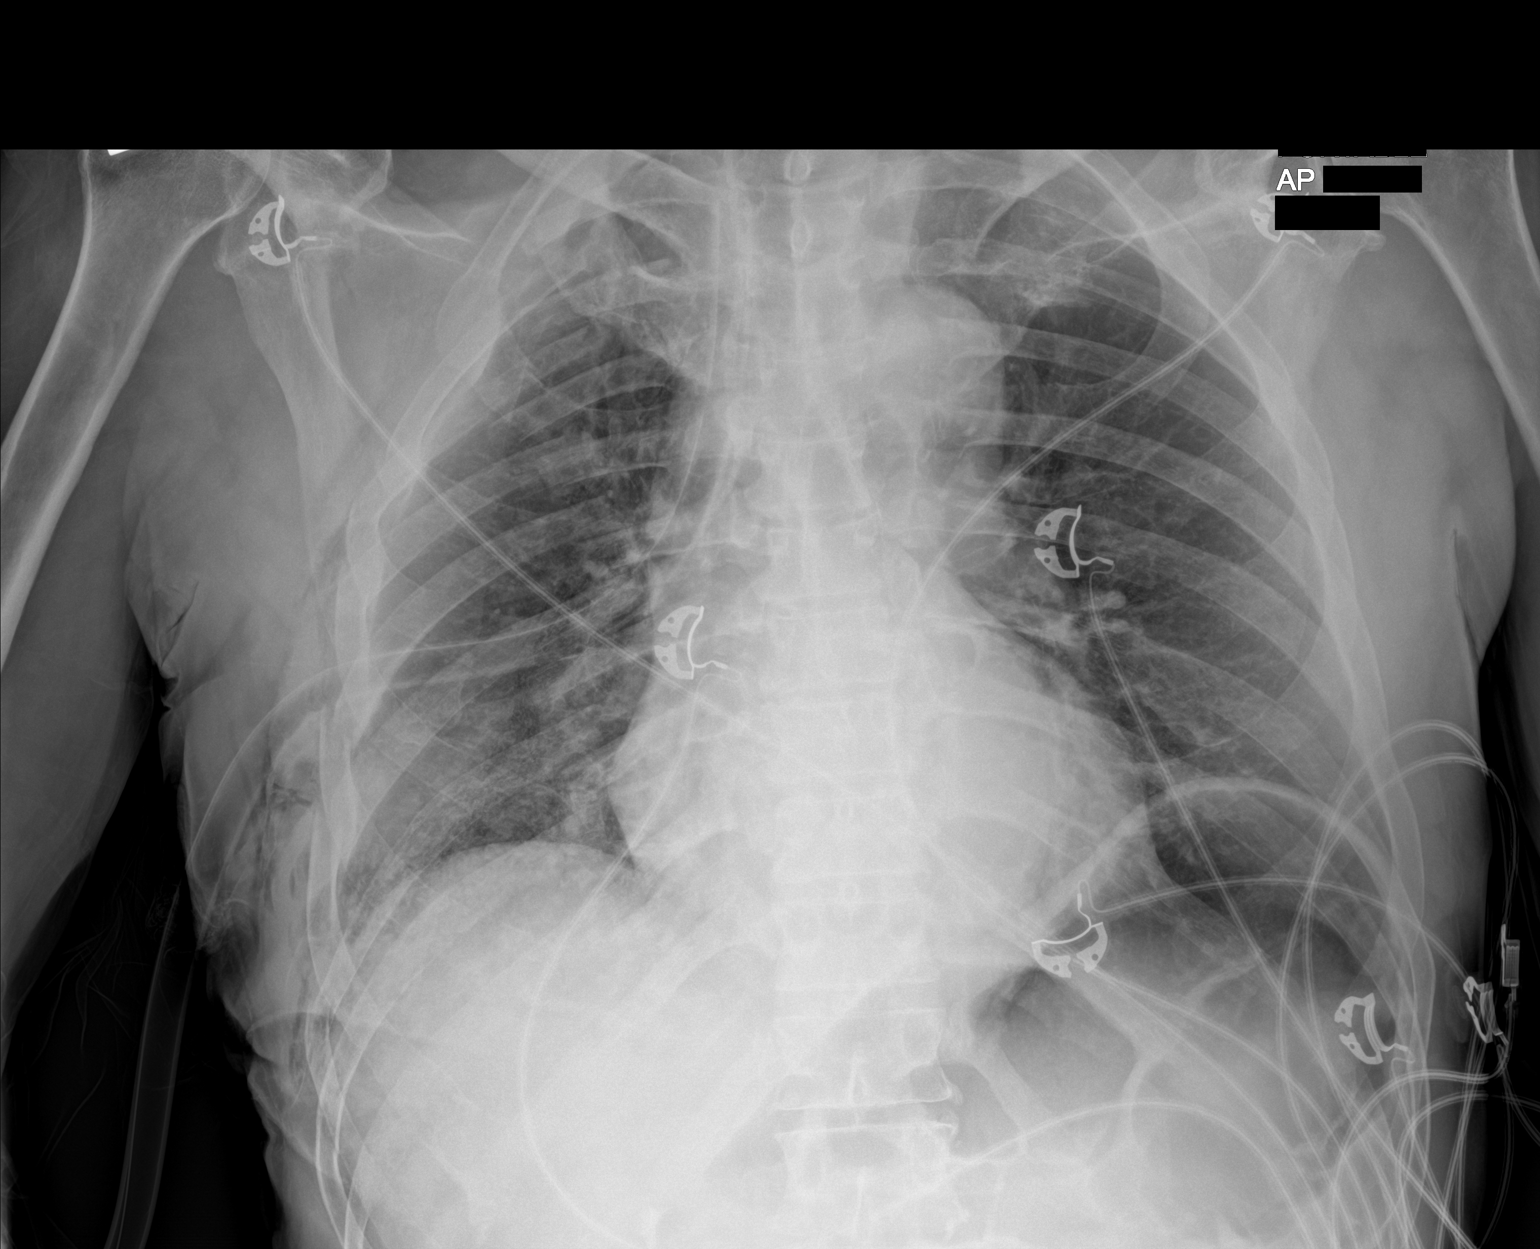

[1 of 1 positions shown; findings below may reference images not displayed]

FINDINGS: Right chest tube has been placed. Tip is at the right apex. No
visible pneumothorax. Subcutaneous emphysema in the right chest
wall. Multiple acute displaced rib fractures. Shallow inspiration.
Heart size and pulmonary vascularity are normal. Left lung is clear
and expanded. Probable nondisplaced fracture of the distal right
clavicle.
IMPRESSION: Right chest tube placed. No visible pneumothorax. Multiple acute
displaced right rib fractures. Probable nondisplaced fracture of the
distal right clavicle. Subcutaneous emphysema.

## 2021-02-20 IMAGING — CT CT CHEST WITH CONTRAST
2 of 5 series · 12 of 36 positions shown, 15 images · IV contrast (omnipaque)
Comparison: Thoracic and lumbar spine radiographs performed earlier
today at [DATE] p.m.

CLINICAL DATA: Status post fall off ladder onto back, with upper
back pain. Concern for chest or abdominal injury. Initial encounter.

EXAM:
CT CHEST, ABDOMEN, AND PELVIS WITH CONTRAST
TECHNIQUE: Multidetector CT imaging of the chest, abdomen and pelvis was
performed following the standard protocol during bolus
administration of intravenous contrast.
CONTRAST:  100mL OMNIPAQUE IOHEXOL 300 MG/ML  SOLN

[Series 2: cap with · axial · 0.84mm/px · z∈[-403,+117]mm · 9 of 132 slices shown, 12 images]
[im 14/132  mediastinal]
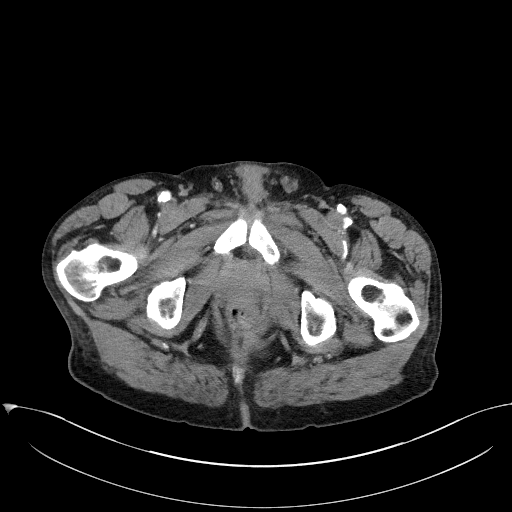
[im 14/132  lung]
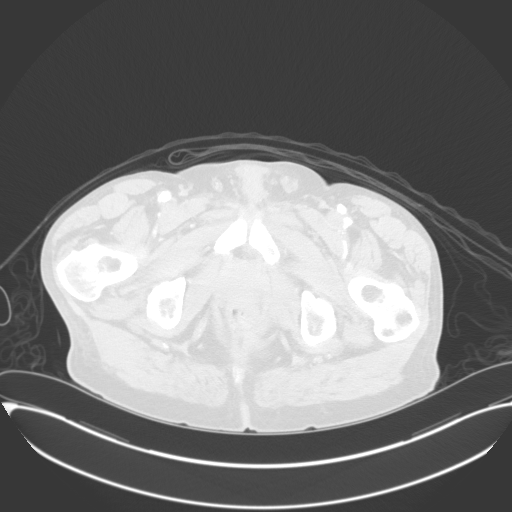
[im 27/132  lung]
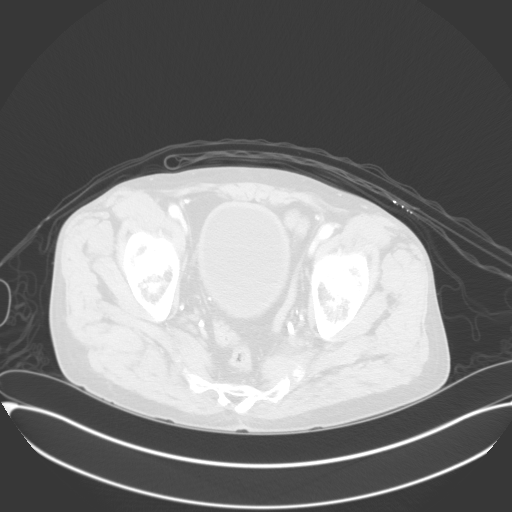
[im 40/132  lung]
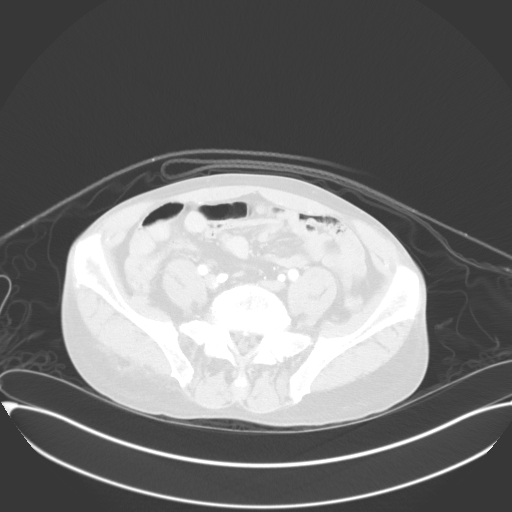
[im 53/132  lung]
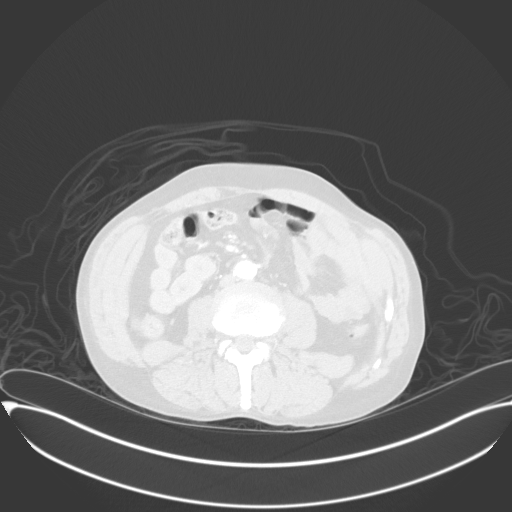
[im 66/132  mediastinal]
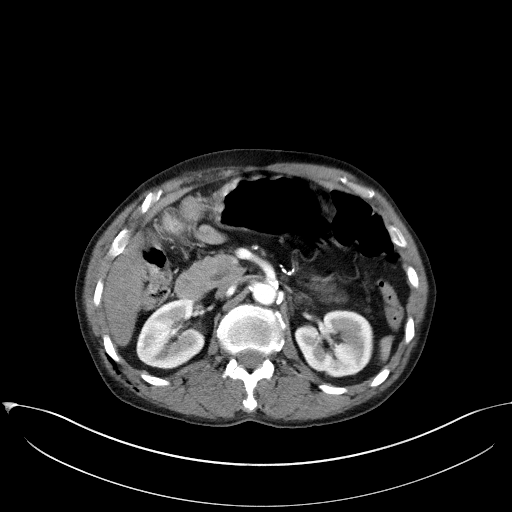
[im 66/132  lung]
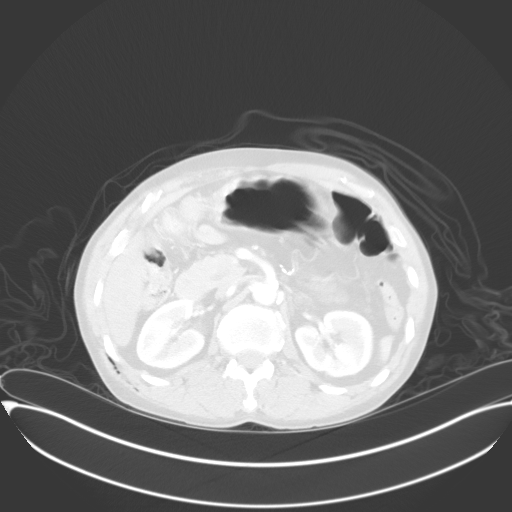
[im 79/132  lung]
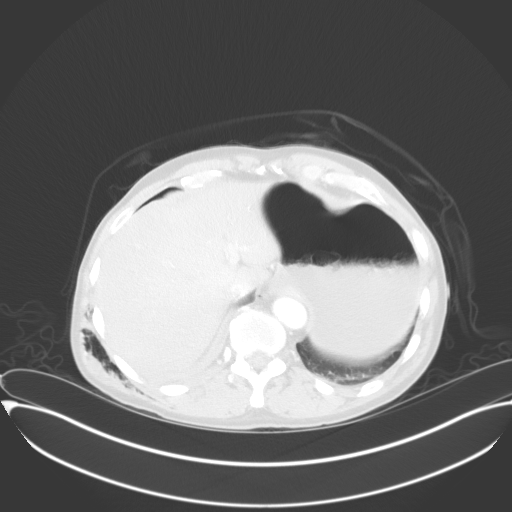
[im 92/132  lung]
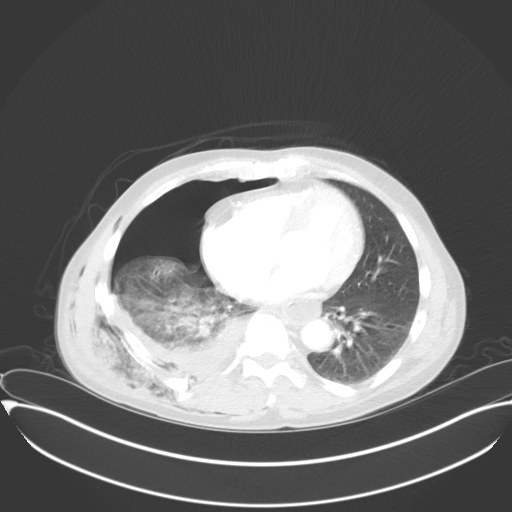
[im 105/132  lung]
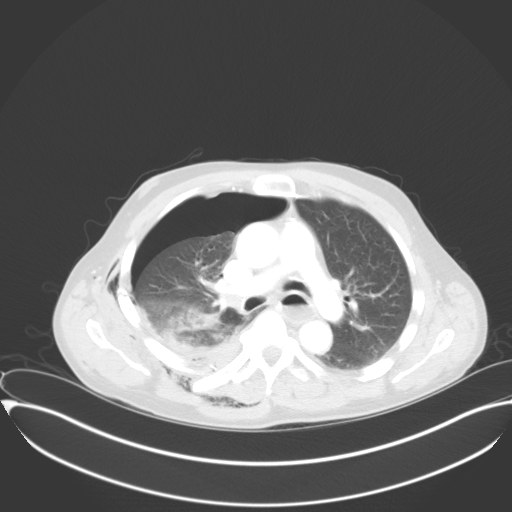
[im 118/132  mediastinal]
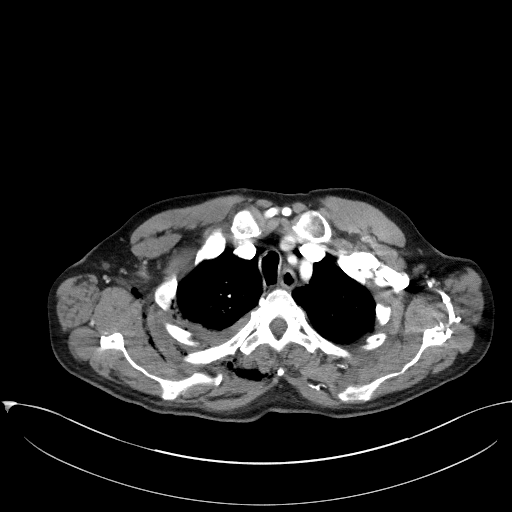
[im 118/132  lung]
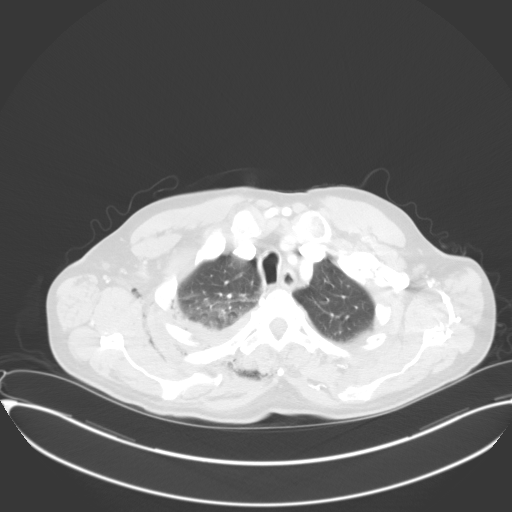

[Series 5: coronals · coronal · 0.77mm/px · 3 of 116 slices shown]
[im 24/116  lung]
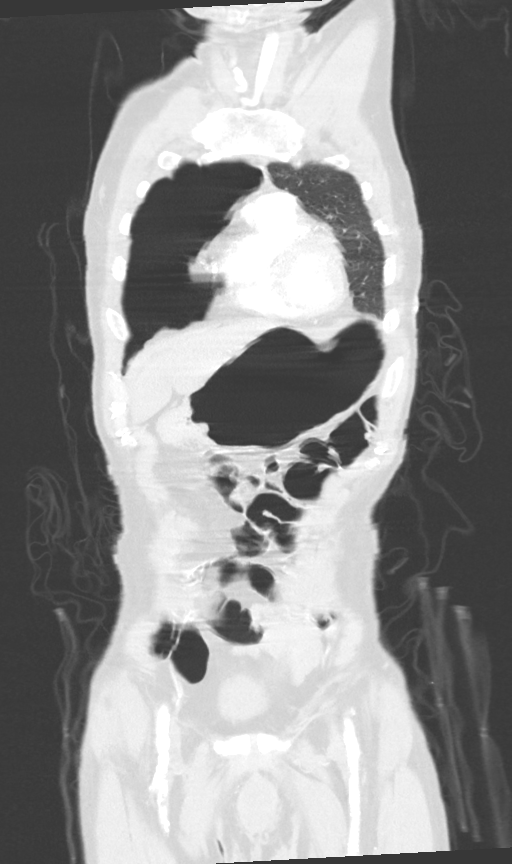
[im 47/116  lung]
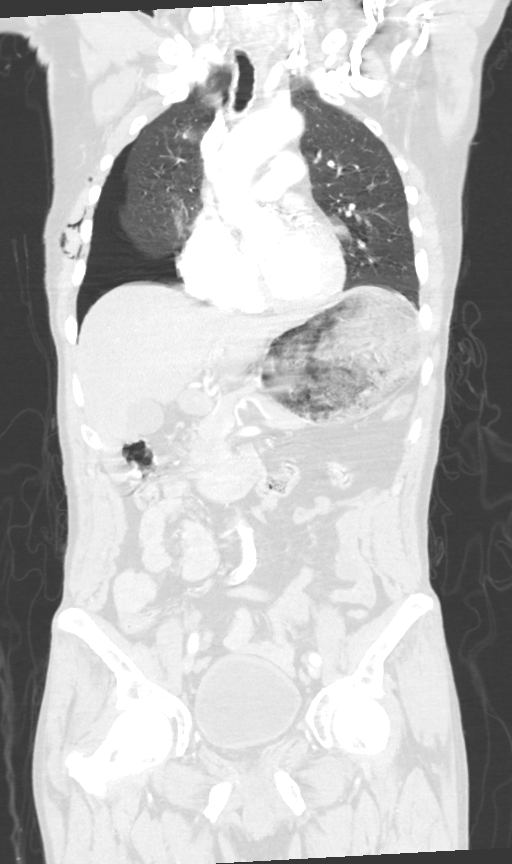
[im 70/116  lung]
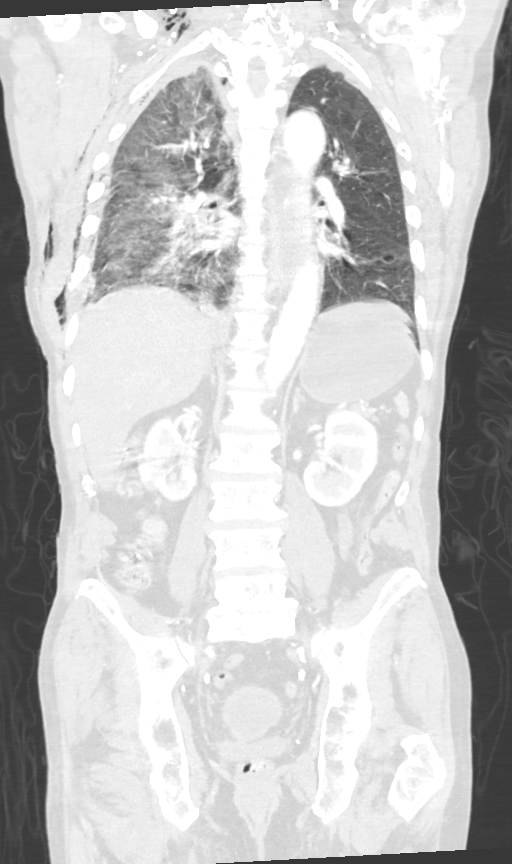

[12 of 36 positions shown; findings below may reference images not displayed]

FINDINGS: CT CHEST FINDINGS

Cardiovascular: The heart remains normal in size. The thoracic aorta
is grossly unremarkable. There is no evidence of aortic injury. The
great vessels are within normal limits.

Mediastinum/Nodes: Diffuse coronary artery calcifications are seen.
The esophagus is filled with fluid and air; this may reflect mild
esophageal dysmotility. No mediastinal lymphadenopathy is seen. No
pericardial effusion is identified.

A 3 cm vague nodular mass is noted at the left thyroid lobe, better
characterized on concurrent CT of the cervical spine. No axillary
lymphadenopathy is seen.

Lungs/Pleura: There is a relatively large right-sided
hemopneumothorax, with partial collapse of much of the right lung.
There are significantly displaced fractures of the right first
through eleventh ribs, with numerous ribs fractured in multiple
locations, raising potential for flail chest.

The left lung appears relatively clear.

Musculoskeletal: There is question of a fracture of the distal right
clavicle, not well characterized on this study. The scapulae appear
grossly intact. Extensive soft tissue air is seen tracking along the
right side of the back.

CT ABDOMEN PELVIS FINDINGS

Hepatobiliary: The liver is unremarkable in appearance. The
gallbladder is unremarkable in appearance. The common bile duct
remains normal in caliber.

Pancreas: The pancreas is within normal limits.

Spleen: The spleen is unremarkable in appearance.

Adrenals/Urinary Tract: The adrenal glands are unremarkable in
appearance. The kidneys are within normal limits. There is no
evidence of hydronephrosis. No renal or ureteral stones are
identified. No perinephric stranding is seen.

Stomach/Bowel: The stomach is unremarkable in appearance. The small
bowel is within normal limits. The appendix is normal in caliber,
without evidence of appendicitis. The colon is unremarkable in
appearance.

Vascular/Lymphatic: Scattered calcification is seen along the
abdominal aorta and its branches. The abdominal aorta is otherwise
grossly unremarkable. The inferior vena cava is grossly
unremarkable. No retroperitoneal lymphadenopathy is seen. No pelvic
sidewall lymphadenopathy is identified.

Reproductive: The bladder is mildly distended and grossly
unremarkable. The prostate is mildly enlarged, measuring 5.2 cm in
transverse dimension.

Other: No additional soft tissue abnormalities are seen.

Musculoskeletal: No acute osseous abnormalities are identified. The
visualized musculature is unremarkable in appearance.
IMPRESSION: 1. Relatively large right-sided hemopneumothorax, with partial
collapse of much of the right lung.
2. Significantly displaced fractures of the right first through
eleventh ribs, with numerous ribs fractured in multiple locations,
raising potential for flail chest.
3. Question of fracture of the distal right clavicle, not well
characterized on this study.
4. Extensive soft tissue air tracking along the right side of the
back.
5. Diffuse coronary artery calcifications seen.
6. Esophagus filled with fluid and air; this may reflect mild
esophageal dysmotility.
7. Mildly enlarged prostate.

Aortic Atherosclerosis ([id number]).

Critical Value/emergent results were called by telephone at the time
of interpretation on 04/08/2018 at [DATE] to ONEZIME BIMAY PA, who
verbally acknowledged these results.
